# Patient Record
Sex: Male | Born: 1975 | Race: Black or African American | Hispanic: No | Marital: Married | Smoking: Former smoker
Health system: Southern US, Community
[De-identification: ages and names within clinical notes are randomized; demographics above are authoritative.]

## PROBLEM LIST (undated history)

## (undated) DIAGNOSIS — E079 Disorder of thyroid, unspecified: Secondary | ICD-10-CM

## (undated) DIAGNOSIS — E7800 Pure hypercholesterolemia, unspecified: Secondary | ICD-10-CM

## (undated) DIAGNOSIS — M549 Dorsalgia, unspecified: Secondary | ICD-10-CM

## (undated) DIAGNOSIS — I10 Essential (primary) hypertension: Secondary | ICD-10-CM

## (undated) HISTORY — DX: Dorsalgia, unspecified: M54.9

---

## 2009-04-13 ENCOUNTER — Emergency Department (HOSPITAL_COMMUNITY): Admission: EM | Admit: 2009-04-13 | Discharge: 2009-04-13 | Payer: Self-pay | Admitting: Emergency Medicine

## 2009-06-18 ENCOUNTER — Encounter: Admission: RE | Admit: 2009-06-18 | Discharge: 2009-06-18 | Payer: Self-pay | Admitting: Infectious Diseases

## 2009-07-02 ENCOUNTER — Encounter (INDEPENDENT_AMBULATORY_CARE_PROVIDER_SITE_OTHER): Payer: Self-pay | Admitting: *Deleted

## 2009-08-02 DIAGNOSIS — M549 Dorsalgia, unspecified: Secondary | ICD-10-CM | POA: Insufficient documentation

## 2009-08-02 DIAGNOSIS — R109 Unspecified abdominal pain: Secondary | ICD-10-CM | POA: Insufficient documentation

## 2009-08-02 DIAGNOSIS — R21 Rash and other nonspecific skin eruption: Secondary | ICD-10-CM

## 2009-08-05 ENCOUNTER — Ambulatory Visit: Payer: Self-pay | Admitting: Gastroenterology

## 2009-08-05 ENCOUNTER — Encounter (INDEPENDENT_AMBULATORY_CARE_PROVIDER_SITE_OTHER): Payer: Self-pay | Admitting: *Deleted

## 2009-08-09 ENCOUNTER — Ambulatory Visit (HOSPITAL_COMMUNITY): Admission: RE | Admit: 2009-08-09 | Discharge: 2009-08-09 | Payer: Self-pay | Admitting: Gastroenterology

## 2009-08-13 LAB — CONVERTED CEMR LAB
ALT: 23 units/L (ref 0–53)
AST: 25 units/L (ref 0–37)
Alkaline Phosphatase: 65 units/L (ref 39–117)
Amylase: 173 units/L — ABNORMAL HIGH (ref 27–131)
BUN: 5 mg/dL — ABNORMAL LOW (ref 6–23)
Basophils Relative: 0.8 % (ref 0.0–3.0)
Chloride: 102 meq/L (ref 96–112)
Eosinophils Absolute: 0.2 10*3/uL (ref 0.0–0.7)
Eosinophils Relative: 3.7 % (ref 0.0–5.0)
Glucose, Bld: 93 mg/dL (ref 70–99)
Iron: 131 ug/dL (ref 42–165)
Lipase: 14 units/L (ref 11.0–59.0)
Lymphocytes Relative: 35.9 % (ref 12.0–46.0)
Potassium: 4.1 meq/L (ref 3.5–5.1)
Total Bilirubin: 1 mg/dL (ref 0.3–1.2)
Total Protein: 7.5 g/dL (ref 6.0–8.3)
Transferrin: 255.1 mg/dL (ref 212.0–360.0)
Vitamin B-12: 150 pg/mL — ABNORMAL LOW (ref 211–911)
WBC: 4.4 10*3/uL — ABNORMAL LOW (ref 4.5–10.5)

## 2009-08-16 ENCOUNTER — Ambulatory Visit: Payer: Self-pay | Admitting: Gastroenterology

## 2009-08-17 LAB — CONVERTED CEMR LAB: UREASE: NEGATIVE

## 2009-08-18 ENCOUNTER — Telehealth (INDEPENDENT_AMBULATORY_CARE_PROVIDER_SITE_OTHER): Payer: Self-pay | Admitting: *Deleted

## 2009-08-19 ENCOUNTER — Encounter: Payer: Self-pay | Admitting: Gastroenterology

## 2009-08-31 ENCOUNTER — Encounter (INDEPENDENT_AMBULATORY_CARE_PROVIDER_SITE_OTHER): Payer: Self-pay | Admitting: *Deleted

## 2009-09-01 ENCOUNTER — Ambulatory Visit: Payer: Self-pay | Admitting: Gastroenterology

## 2009-09-01 DIAGNOSIS — D518 Other vitamin B12 deficiency anemias: Secondary | ICD-10-CM | POA: Insufficient documentation

## 2009-09-10 ENCOUNTER — Ambulatory Visit: Payer: Self-pay | Admitting: Gastroenterology

## 2009-09-15 ENCOUNTER — Ambulatory Visit: Payer: Self-pay | Admitting: Gastroenterology

## 2009-09-17 ENCOUNTER — Ambulatory Visit: Payer: Self-pay | Admitting: Gastroenterology

## 2009-10-15 ENCOUNTER — Telehealth: Payer: Self-pay | Admitting: Gastroenterology

## 2009-10-15 ENCOUNTER — Ambulatory Visit: Payer: Self-pay | Admitting: Gastroenterology

## 2009-10-20 ENCOUNTER — Encounter (INDEPENDENT_AMBULATORY_CARE_PROVIDER_SITE_OTHER): Payer: Self-pay | Admitting: *Deleted

## 2009-11-12 ENCOUNTER — Ambulatory Visit: Payer: Self-pay | Admitting: Gastroenterology

## 2010-08-30 NOTE — Assessment & Plan Note (Signed)
Summary: b12 shot...em  Nurse Visit   Allergies: No Known Drug Allergies  Medication Administration  Injection # 1:    Medication: Vit B12 1000 mcg    Diagnosis: ANEMIA, VITAMIN B12 DEFICIENCY (ICD-281.1)    Route: IM    Site: L deltoid    Exp Date: 11/12    Lot #: [id number]    Mfr: American Regent    Comments: #2 B12 injection next week.    Patient tolerated injection without complications    Given by: Milford Cage NCMA (September 01, 2009 2:34 PM)  Orders Added: 1)  Vit B12 1000 mcg [J3420]

## 2010-08-30 NOTE — Assessment & Plan Note (Signed)
Summary: WEEKLY B12 SHOT...LSW.  Nurse Visit   Allergies: No Known Drug Allergies  Medication Administration  Injection # 1:    Medication: Vit B12 1000 mcg    Diagnosis: ANEMIA, VITAMIN B12 DEFICIENCY (ICD-281.1)    Route: IM    Site: L deltoid    Exp Date: 11/12    Lot #: [id number]    Mfr: American Regent    Patient tolerated injection without complications    Given by: Hortense Ramal CMA Duncan Dull) (September 10, 2009 1:59 PM)  Orders Added: 1)  Vit B12 1000 mcg [J3420]

## 2010-08-30 NOTE — Assessment & Plan Note (Signed)
Summary: stomach pain/pos ulcers...as.   History of Present Illness Visit Type: consult Primary GI MD: Sheryn Bison MD FACP FAGA Primary Provider: Levander Campion, MD Requesting Provider: Levander Campion, MD Chief Complaint: abdominal pain for years, pain can happen any time of day with no relation to food History of Present Illness:   This patient is a 35 year old male from [place] referred by Dr. Levander Campion for evaluation of years of recurrent abdominal gas, bloating, epigastric and lower gastrointestinal abdominal pain. He is been treated on several occasions for H. pylori in without improvement in his symptomatology. He denies any history of known ulcerations but apparently has had previous endoscopy in [place]. He is rather definite lactose intolerance which seems to exacerbate his abdominal cramping and excessive gas. He denies melena, hematochezia, anorexia, weight loss, or any specific hepatobiliary problems. He recently completed therapy with PPI, amoxicillin, for possible H. pylori  infection. serologic studies for celiac disease were negative. The patient specifically denies any systemic complaints such as fever, chills, nausea and vomiting, et Karie Soda. 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 Family history is unclear.   GI Review of Systems    Reports abdominal pain and  acid reflux.     Location of  Abdominal pain: generalized.    Denies belching, bloating, chest pain, dysphagia with liquids, dysphagia with solids, heartburn, loss of appetite, nausea, vomiting, vomiting blood, weight loss, and  weight gain.      Reports black tarry stools.     Denies anal fissure, change in bowel habit, constipation, diarrhea, diverticulosis, fecal incontinence, heme positive stool, hemorrhoids, irritable bowel syndrome, jaundice, light color stool, liver problems, rectal bleeding, and  rectal pain.    Current Medications (verified): 1)  Isoniazid  300 Mg Tabs (Isoniazid) .... Take 1 Tablet By Mouth Once A Day For 9 Months 2)  Vitamin B-6 25 Mg Tabs (Pyridoxine Hcl) .... Take 1 Tablet By Mouth Once A Day  Allergies (verified): No Known Drug Allergies  Past History:  Past medical, surgical, family and social histories (including risk factors) reviewed for relevance to current acute and chronic problems.  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 No FH of Colon Cancer:  Social History: Reviewed history from 08/02/2009 and no changes required. Married Patient is a former smoker.  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  The patient denies change in vision, menstrual pain, and pregnancy symptoms.    Vital Signs:  Patient profile:   35 year old male Height:      70.25 inches Weight:      137 pounds BMI:     19.59 Pulse rate:   80 / minute Pulse rhythm:   regular BP sitting:   100 / 72  (left arm) Cuff size:   regular  Vitals Entered By: Francee Piccolo CMA Duncan Dull) (August 05, 2009 9:20 AM)  Physical Exam  General:  Well developed, well nourished, no acute distress.healthy appearing.   Head:  Normocephalic and atraumatic. Eyes:  PERRLA, no icterus.exam deferred to patient's ophthalmologist.   Neck:  Supple; no masses or thyromegaly. Lungs:  Clear throughout to auscultation. Heart:  Regular rate and rhythm; no murmurs,  rubs,  or bruits. Abdomen:  Soft, nontender and nondistended. No masses, hepatosplenomegaly or hernias noted. Normal bowel sounds. Extremities:  No clubbing, cyanosis, edema or deformities noted. Neurologic:  Alert and  oriented x4;  grossly normal neurologically. Cervical Nodes:  No  significant cervical adenopathy. Inguinal Nodes:  No significant inguinal adenopathy. Psych:  Alert and cooperative. Normal mood and affect.   Impression & Recommendations:  Problem # 1:  ABDOMINAL PAIN (ICD-789.00) Assessment Unchanged His symptomatology is most consistent with lactose intolerance and irritable bowel syndrome. I seriously doubt that he has active H. pylori infection. I have ordered ultrasound to exclude cholelithiasis, repeat labs including liver function tests, and we'll proceed with endoscopic exam with gastric and small bowel biopsies. I placed him on a lactose free diet with p.r.n. Lactaid use. I have little to add to her otherwise excellent workup with Dr. Gertie Gowda.Depending on his clinical workup and course, he may need colonoscopy exam and also consideration of stool exams to exclude intestinal parasites. Orders: TLB-CBC Platelet - w/Differential (85025-CBCD) TLB-BMP (Basic Metabolic Panel-BMET) (80048-METABOL) TLB-Hepatic/Liver Function Pnl (80076-HEPATIC) TLB-TSH (Thyroid Stimulating Hormone) (84443-TSH) TLB-B12, Serum-Total ONLY (54098-J19) TLB-Ferritin (82728-FER) TLB-Folic Acid (Folate) (82746-FOL) TLB-IBC Pnl (Iron/FE;Transferrin) (83550-IBC) TLB-Amylase (82150-AMYL) TLB-Lipase (83690-LIPASE)  Problem # 2:  POSITIVE PPD (ICD-795.5) Assessment: Unchanged  Patient Instructions: 1)  Copy sent to : Dr. Levander Campion 2)  Please continue current medications.  3)  Lactose Free Diet handout given.  4)  Conscious Sedation brochure given.  5)  Upper Endoscopy brochure given.  6)  Ultrasound scheduled. 7)  Trial of Levsin as needed. 8)  The medication list was reviewed and reconciled.  All changed / newly prescribed medications were explained.  A complete medication list was provided to the patient / caregiver.  Appended Document: stomach pain/pos ulcers...as.    Clinical Lists Changes  Medications: Added new medication of LEVSIN 0.125 MG  TABS  (HYOSCYAMINE SULFATE) Take one tab q 4-6 hrs prn - Signed Rx of LEVSIN 0.125 MG  TABS (HYOSCYAMINE SULFATE) Take one tab q 4-6 hrs prn;  #40 x 3;  Signed;  Entered by: Ashok Cordia RN;  Authorized by: Mardella Layman MD [facility];  Method used: Electronically to [facility] [phone]*, [address], [place], [place], [place]  [address], Ph: [phone], Fax: [phone] Orders: Added new Test order of EGD (EGD) - Signed Added new Test order of Ultrasound Abdomen (UAS) - Signed    Prescriptions: LEVSIN 0.125 MG  TABS (HYOSCYAMINE SULFATE) Take one tab q 4-6 hrs prn  #40 x 3   Entered by:   Ashok Cordia RN   Authorized by:   Mardella Layman MD [facility]   Signed by:   Ashok Cordia RN on 08/05/2009   Method used:   Electronically to        [facility] [phone]* (retail)       [address]       [place], [place]  [address]       Ph: [phone]       Fax: [phone]   RxID:   [id number]

## 2010-08-30 NOTE — Progress Notes (Signed)
Summary: Reporting  Phone Note Call from Patient   Caller: Patient Summary of Call: Pt reporting:  Medication given when here last is not helping.  Pt states Dr. Jarold Motto told him if med did not help that it could be changed to something else.  Pt did not know name of med, but would like to try something else.  Levsin was givne at last OV.  Colon report mentions trial of amitiza.   Initial call taken by: Ashok Cordia RN,  October 15, 2009 3:34 PM  Follow-up for Phone Call        office f/u Follow-up by: Mardella Layman MD FACG,  October 18, 2009 9:46 AM  Additional Follow-up for Phone Call Additional follow up Details #1::        LM for pt to call and sch appt. Ashok Cordia RN  October 19, 2009 9:51 AM  Letter sent to pt requesting pt call and make appt. Additional Follow-up by: Ashok Cordia RN,  October 20, 2009 3:55 PM

## 2010-08-30 NOTE — Assessment & Plan Note (Signed)
Summary: WEEKLY B12 SHOT...LSW.  Nurse Visit   Medication Administration  Injection # 1:    Medication: Vit B12 1000 mcg    Diagnosis: ANEMIA, VITAMIN B12 DEFICIENCY (ICD-281.1)    Route: IM    Site: L deltoid    Exp Date: 06/2011    Lot #: [id number]    Mfr: American Regent    Comments: pt will return on 3/18 for next injection    Patient tolerated injection without complications    Given by: Francee Piccolo CMA Duncan Dull) (September 17, 2009 3:38 PM)

## 2010-08-30 NOTE — Miscellaneous (Signed)
Summary: Clotest  Clinical Lists Changes  Orders: Added new Test order of TLB-H Pylori Screen Gastric Biopsy (83013-CLOTEST) - Signed 

## 2010-08-30 NOTE — Procedures (Signed)
Summary: Upper Endoscopy  Patient: [name] Note: All result statuses are Final unless otherwise noted.  Tests: (1) Upper Endoscopy (EGD)   EGD Upper Endoscopy       DONE     [facility]     [address]     [place], [place]  [address]           ENDOSCOPY PROCEDURE REPORT           PATIENT:  Allen Ramos, Allen Ramos  MR#:  [id number]     BIRTHDATE:  1976-03-16, 33 yrs. old  GENDER:  male           ENDOSCOPIST:  Vania Rea. Jarold Motto, MD, [facility]     Referred by:           PROCEDURE DATE:  08/16/2009     PROCEDURE:  EGD with biopsy     ASA CLASS:  Class II     INDICATIONS:  abdominal pain Recurrent epigastric pain and hx of     H.pylori Rx.Ultrasound exam is WNL.           MEDICATIONS:   Fentanyl 50 mcg IV, Versed 6 mg IV     TOPICAL ANESTHETIC:  Exactacain Spray           DESCRIPTION OF PROCEDURE:   After the risks benefits and     alternatives of the procedure were thoroughly explained, informed     consent was obtained.  The LB [id number] endoscope was     introduced through the mouth and advanced to the second portion of     the duodenum, without limitations.  The instrument was slowly     withdrawn as the mucosa was fully examined.     <<PROCEDUREIMAGES>>           Normal duodenal folds were noted. biopsy done.  The esophagus and     gastroesophageal junction were completely normal in appearance.     The stomach was entered and closely examined. The antrum,     angularis, and lesser curvature were well visualized, including a     retroflexed view of the cardia and fundus. The stomach wall was     normally distensable. The scope passed easily through the pylorus     into the duodenum. clo and regular biopsies done.    Retroflexed     views revealed no abnormalities.    The scope was then withdrawn     from the patient and the procedure completed.           COMPLICATIONS:  None           ENDOSCOPIC IMPRESSION:     1) Normal duodenal folds     2) Normal  esophagus     3) Normal stomach     NO ACUTE OR CHRONIC PUD.PROBABLE FUNCTIONAL DYSPEPSIA.     RECOMMENDATIONS:     1) await biopsy results     2) continue current medications     3) Rx CLO if positive     4) My office will schedule a colonoscopy           REPEAT EXAM:  No           ______________________________     Vania Rea. Jarold Motto, MD, [name]           CC:           n.     eSIGNED:   Vania Rea. Patterson at 08/16/2009 03:12 PM  [name], [name], [phone]  Note: An exclamation mark (!) indicates a result that was not dispersed into the flowsheet. Document Creation Date: 08/16/2009 3:09 PM _______________________________________________________________________  (1) Order result status: Final Collection or observation date-time: 08/16/2009 15:02 Requested date-time:  Receipt date-time:  Reported date-time:  Referring Physician:   Ordering Physician: Sheryn Bison [phone]) Specimen Source:  Source: Launa Grill Order Number: [id number] Lab site:   Appended Document: Upper Endoscopy PLEASE SCHEDULE COLONOSCOPY...

## 2010-08-30 NOTE — Miscellaneous (Signed)
Summary: [facility] Previsit/prep  Clinical Lists Changes  Medications: Added new medication of MOVIPREP 100 GM  SOLR (PEG-KCL-NACL-NASULF-NA ASC-C) As per prep instructions. - Signed Rx of MOVIPREP 100 GM  SOLR (PEG-KCL-NACL-NASULF-NA ASC-C) As per prep instructions.;  #1 x 0;  Signed;  Entered by: Wyona Almas RN;  Authorized by: Mardella Layman MD [facility];  Method used: Electronically to [facility]. [phone]*, [address] [place], [place], [place]  [address], Ph: [phone], Fax: [phone] Observations: Added new observation of NKA: T (09/01/2009 13:26)    Prescriptions: MOVIPREP 100 GM  SOLR (PEG-KCL-NACL-NASULF-NA ASC-C) As per prep instructions.  #1 x 0   Entered by:   Wyona Almas RN   Authorized by:   Mardella Layman MD [facility]   Signed by:   Wyona Almas RN on 09/01/2009   Method used:   Electronically to        [facility] [phone]* (retail)       [address]       [place], [place]  [address]       Ph: [phone]       Fax: [phone]   RxID:   [id number]

## 2010-08-30 NOTE — Assessment & Plan Note (Signed)
Summary: stomach pain...em   History of Present Illness Visit Type: Follow-up Visit Primary GI MD: Sheryn Bison MD FACP FAGA Primary Provider: Levander Campion, MD Requesting Provider: Levander Campion, MD Chief Complaint: Patient c/o several years intermittent epigastric discomfort along with some occasional chest discomfort. He denies any nasuea, vomiting or lower GI symptoms with the exception of some constipation. History of Present Illness:   Continued periodic crampy mid abdominal pain without other symptomatology. He has had an extensive evaluation including ultrasound, endoscopy, colonoscopy, and multiple biopsies all of which have been normal. He also continues with chronic constipation refractory to a trial of Amitiza therapy. There no systemic complaints or hepatobiliary symptoms. There is no obvious language barrier which makes communication difficult.   GI Review of Systems    Reports abdominal pain, acid reflux, and  heartburn.     Location of  Abdominal pain: epigastric area.    Denies belching, bloating, chest pain, dysphagia with liquids, dysphagia with solids, loss of appetite, nausea, vomiting, vomiting blood, weight loss, and  weight gain.      Reports constipation.     Denies anal fissure, black tarry stools, change in bowel habit, diarrhea, diverticulosis, fecal incontinence, heme positive stool, hemorrhoids, irritable bowel syndrome, jaundice, light color stool, liver problems, rectal bleeding, and  rectal pain.    Current Medications (verified): 1)  Isoniazid 300 Mg Tabs (Isoniazid) .... Take 1 Tablet By Mouth Once A Day For 9 Months 2)  Vitamin B-6 25 Mg Tabs (Pyridoxine Hcl) .... Take 1 Tablet By Mouth Once A Day  Allergies (verified): No Known Drug Allergies  Past History:  Past medical, surgical, family and social histories (including risk factors) reviewed for relevance to current acute and chronic problems.  Past Medical History: Reviewed history from  08/05/2009 and no changes required. 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 none  Family History: Reviewed history from 08/05/2009 and no changes required. No FH of Colon Cancer:  Social History: Reviewed history from 08/05/2009 and no changes required. Married Patient is a former smoker.  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  The patient denies allergy/sinus, anemia, anxiety-new, arthritis/joint pain, blood in urine, breast changes/lumps, change in vision, confusion, cough, coughing up blood, depression-new, fainting, fatigue, fever, headaches-new, hearing problems, heart murmur, heart rhythm changes, itching, menstrual pain, muscle pains/cramps, night sweats, nosebleeds, pregnancy symptoms, shortness of breath, skin rash, sleeping problems, sore throat, swelling of feet/legs, swollen lymph glands, thirst - excessive , urination - excessive , urination changes/pain, urine leakage, vision changes, and voice change.    Vital Signs:  Patient profile:   35 year old male Height:      70.25 inches Weight:      139.25 pounds BMI:     19.91 BSA:     1.80 Pulse rate:   88 / minute Pulse rhythm:   regular BP sitting:   120 / 64  (left arm)  Vitals Entered By: Hortense Ramal CMA Duncan Dull) (November 12, 2009 11:30 AM)  Physical Exam  General:  Well developed, well nourished, no acute distress. Head:  Normocephalic and atraumatic. Eyes:  PERRLA, no icterus.exam deferred to patient's ophthalmologist.  exam deferred to patient's ophthalmologist.   Abdomen:  Soft, nontender and nondistended. No masses,  hepatosplenomegaly or hernias noted. Normal bowel sounds. Psych:  Alert and cooperative. Normal mood and  affect.depressed affect.  depressed affect.     Impression & Recommendations:  Problem # 1:  ANEMIA, VITAMIN B12 DEFICIENCY (ICD-281.1) Assessment Improved Continue parenteral replacement therapy.  Problem # 2:  POSITIVE PPD (ICD-795.5) Assessment: Unchanged continue isoniazid therapy as per infectious disease  Problem # 3:  ABDOMINAL PAIN (ICD-789.00) Assessment: Unchanged Workup consistent with IBS, constipation predominant. Have instituted a trial of AcipHex 20 mg a day, Pamine 2.5 mg twice a day, 8 ounces of MiraLax at bedtime, and probiotic therapy with [employer] Colon Health b.i.d.  Anticoagulation Management Assessment/Plan:            Patient Instructions: 1)  Begin Miralax at bedtime and [facility] Colon health two times a day. You may purchase these at your drugstore.   2)  Rx will be sent to your pharmacy for aciphex and pamine. 3)  The medication list was reviewed and reconciled.  All changed / newly prescribed medications were explained.  A complete medication list was provided to the patient / caregiver. 4)  Copy sent to : Dr. Levander Campion.  Appended Document: stomach pain...em    Clinical Lists Changes  Orders: Added new Service order of Vit B12 1000 mcg (J3420) - Signed       Medication Administration  Injection # 1:    Medication: Vit B12 1000 mcg    Diagnosis: ANEMIA, VITAMIN B12 DEFICIENCY (ICD-281.1)    Route: IM    Site: L deltoid    Exp Date: 2/13    Lot #: [id number]    Mfr: American Regent    Patient tolerated injection without complications    Given by: Hortense Ramal CMA Duncan Dull) (November 12, 2009 12:02 PM)  Orders Added: 1)  Vit B12 1000 mcg [J3420]  Appended Document: stomach pain...em    Clinical Lists Changes  Medications: Added new medication of MIRALAX   POWD (POLYETHYLENE GLYCOL 3350) q hs Added new medication of [facility] COLON HEALTH  CAPS (PROBIOTIC PRODUCT) two times a day Added new medication of ACIPHEX 20 MG TBEC (RABEPRAZOLE  SODIUM) 1 by mouth q am - Signed Added new medication of PAMINE 2.5 MG  TABS (METHSCOPOLAMINE BROMIDE) 1 by mouth two times a day - Signed Rx of ACIPHEX 20 MG TBEC (RABEPRAZOLE SODIUM) 1 by mouth q am;  #30 x 11;  Signed;  Entered by: Ashok Cordia RN;  Authorized by: Mardella Layman MD [facility];  Method used: Electronically to [facility]. [phone]*, [address] [place], [place], [place]  [address], Ph: [phone], Fax: [phone] Rx of PAMINE 2.5 MG  TABS (METHSCOPOLAMINE BROMIDE) 1 by mouth two times a day;  #60 x 6;  Signed;  Entered by: Ashok Cordia RN;  Authorized by: Mardella Layman MD [facility];  Method used: Electronically to [facility]. [phone]*, [address] [place], [place], [place]  [address], Ph: [phone], Fax: [phone]    Prescriptions: PAMINE 2.5 MG  TABS (METHSCOPOLAMINE BROMIDE) 1 by mouth two times a day  #60 x 6   Entered by:   Ashok Cordia RN   Authorized by:   Mardella Layman MD [facility]   Signed by:   Ashok Cordia RN on 11/12/2009   Method used:   Electronically to        [facility] [phone]* (retail)       [address]  [place], [place]  [address]       Ph: [phone]       Fax: [phone]   RxID:   [id number] ACIPHEX 20 MG TBEC (RABEPRAZOLE SODIUM) 1 by mouth q am  #30 x 11   Entered by:   Ashok Cordia RN   Authorized by:   Mardella Layman MD [facility]   Signed by:   Ashok Cordia RN on 11/12/2009   Method used:   Electronically to        [facility] [phone]* (retail)       [address]       [place], [place]  [address]       Ph: [phone]       Fax: [phone]   RxID:   [id number]

## 2010-08-30 NOTE — Letter (Signed)
Summary: [facility] Instructions  [facility]  [address] [place], [place] [address]   Phone: [phone]  Fax: [phone]       Allen Ramos    01-22-1976    [name]        Procedure Day Allen Ramos:  [facility]  09/15/09     Arrival Time:  9:00AM     Procedure Time:  10:00AM     Location of Procedure:                    _X _  [facility] (4th Floor)                        PREPARATION FOR COLONOSCOPY WITH MOVIPREP   Starting 5 days prior to your procedure 09/10/09 do not eat nuts, seeds, popcorn, corn, beans, peas,  salads, or any raw vegetables.  Do not take any fiber supplements (e.g. Metamucil, Citrucel, and Benefiber).  THE DAY BEFORE YOUR PROCEDURE         DATE: 09/14/09  DAY: TUESDAY  1.  Drink clear liquids the entire day-NO SOLID FOOD  2.  Do not drink anything colored red or purple.  Avoid juices with pulp.  No orange juice.  3.  Drink at least 64 oz. (8 glasses) of fluid/clear liquids during the day to prevent dehydration and help the prep work efficiently.  CLEAR LIQUIDS INCLUDE: Water Jello Ice Popsicles Tea (sugar ok, no milk/cream) Powdered fruit flavored drinks Coffee (sugar ok, no milk/cream) Gatorade Juice: apple, white grape, white cranberry  Lemonade Clear bullion, consomm, broth Carbonated beverages (any kind) Strained chicken noodle soup Hard Candy                             4.  In the morning, mix first dose of MoviPrep solution:    Empty 1 Pouch A and 1 Pouch B into the disposable container    Add lukewarm drinking water to the top line of the container. Mix to dissolve    Refrigerate (mixed solution should be used within 24 hrs)  5.  Begin drinking the prep at 5:00 p.m. The MoviPrep container is divided by 4 marks.   Every 15 minutes drink the solution down to the next mark (approximately 8 oz) until the full liter is complete.   6.  Follow completed prep with 16 oz of clear liquid of your choice  (Nothing red or purple).  Continue to drink clear liquids until bedtime.  7.  Before going to bed, mix second dose of MoviPrep solution:    Empty 1 Pouch A and 1 Pouch B into the disposable container    Add lukewarm drinking water to the top line of the container. Mix to dissolve    Refrigerate  THE DAY OF YOUR PROCEDURE      DATE: 09/15/09   DAY: WEDNESDAY  Beginning at 5:00AM (5 hours before procedure):         1. Every 15 minutes, drink the solution down to the next mark (approx 8 oz) until the full liter is complete.  2. Follow completed prep with 16 oz. of clear liquid of your choice.    3. You may drink clear liquids until 8:00AM (2 HOURS BEFORE PROCEDURE).   MEDICATION INSTRUCTIONS  Unless otherwise instructed, you should take regular prescription medications with a small sip of water   as early as possible the  morning of your procedure.         OTHER INSTRUCTIONS  You will need a responsible adult at least 37 years of age to accompany you and drive you home.   This person must remain in the waiting room during your procedure.  Wear loose fitting clothing that is easily removed.  Leave jewelry and other valuables at home.  However, you may wish to bring a book to read or  an iPod/MP3 player to listen to music as you wait for your procedure to start.  Remove all body piercing jewelry and leave at home.  Total time from sign-in until discharge is approximately 2-3 hours.  You should go home directly after your procedure and rest.  You can resume normal activities the  day after your procedure.  The day of your procedure you should not:   Drive   Make legal decisions   Operate machinery   Drink alcohol   Return to work  You will receive specific instructions about eating, activities and medications before you leave.    The above instructions have been reviewed and explained to me by  Wyona Almas RN  September 01, 2009 2:00 PM     I fully  understand and can verbalize these instructions _____________________________ Date _________

## 2010-08-30 NOTE — Assessment & Plan Note (Signed)
Summary: B12 SHOT...LSW.  Nurse Visit   Allergies: No Known Drug Allergies  Medication Administration  Injection # 1:    Medication: Vit B12 1000 mcg    Diagnosis: ANEMIA, VITAMIN B12 DEFICIENCY (ICD-281.1)    Route: IM    Site: L deltoid    Exp Date: 12/12    Lot #: [id number]    Mfr: [employer]ent    Patient tolerated injection without complications    Given by: Ashok Cordia RN (October 15, 2009 3:30 PM)  Orders Added: 1)  Vit B12 1000 mcg [J3420]

## 2010-08-30 NOTE — Progress Notes (Signed)
Summary: B12  Phone Note Call from Patient Call back at [phone]   Caller: Patient Call For: Dr. Jarold Motto Reason for Call: Talk to Nurse Summary of Call: pt says he is supposed to set up B12 injections... how would Dr. Jarold Motto like for these to be sch'ed?... does this need to be discussed with the pt first? Initial call taken by: Vallarie Mare,  August 18, 2009 12:48 PM  Follow-up for Phone Call        Pt needs to be sent up appt to get first B12 injection.  He also needs to sch colonoscopy.  Pt will need interperator, he does not speek english.  Needs to come for previsit with interperator.    Follow-up by: Ashok Cordia RN,  August 18, 2009 1:00 PM

## 2010-08-30 NOTE — Procedures (Signed)
Summary: Colonoscopy  Patient: Laker Thompson Note: All result statuses are Final unless otherwise noted.  Tests: (1) Colonoscopy (COL)   COL Colonoscopy           DONE     [facility]     [address]     [place], [place]  [address]           COLONOSCOPY PROCEDURE REPORT           PATIENT:  [place], [place]  MR#:  [id number]     BIRTHDATE:  1976/01/18, 33 yrs. old  GENDER:  male           ENDOSCOPIST:  Vania Rea. Jarold Motto, MD, [facility]     Referred by:           PROCEDURE DATE:  09/15/2009     PROCEDURE:  Colonoscopy, Diagnostic     ASA CLASS:  Class II     INDICATIONS:  abdominal pain, constipation           MEDICATIONS:   Fentanyl 50 mcg IV, Versed 6 mg IV           DESCRIPTION OF PROCEDURE:   After the risks benefits and     alternatives of the procedure were thoroughly explained, informed     consent was obtained.  Digital rectal exam was performed and     revealed no abnormalities.   The LB CF-[id number] endoscope     was introduced through the anus and advanced to the cecum, which     was identified by both the appendix and ileocecal valve, without     limitations.  The quality of the prep was excellent, using     MoviPrep.  The instrument was then slowly withdrawn as the colon     was fully examined.     <<PROCEDUREIMAGES>>           FINDINGS:  No polyps or cancers were seen.  This was otherwise a     normal examination of the colon.  Internal hemorrhoids were found.     Retroflexed views in the rectum revealed internal hemorrhoids.     The scope was then withdrawn from the patient and the procedure     completed.           COMPLICATIONS:  None           ENDOSCOPIC IMPRESSION:     1) No polyps or cancers     2) Otherwise normal examination     3) Internal hemorrhoids     CHRONIC CONSTIPATION PREDOMINANT IBS.     RECOMMENDATIONS:     1) High fiber diet with liberal fluid intake.     TRIAL OF AMITIZA BID WITH MEALS.MIRALAX 8 OZS QHS PRN.           REPEAT EXAM:  No           ______________________________     Vania Rea. Jarold Motto, MD, [name]           CC:           n.     eSIGNED:   Vania Rea. Patterson at 09/15/2009 10:47 AM           Theodosia Blender, [id number]  Note: An exclamation mark (!) indicates a result that was not dispersed into the flowsheet. Document Creation Date: 09/15/2009 10:47 AM _______________________________________________________________________  (1) Order result status: Final Collection or observation date-time: 09/15/2009 10:41 Requested date-time:  Receipt date-time:  Reported  date-time:  Referring Physician:   Ordering Physician: Sheryn Bison [phone]) Specimen Source:  Source: Launa Grill Order Number: [id number] Lab site:

## 2010-08-30 NOTE — Letter (Signed)
Summary: EGD Instructions  [facility]  [address] [place], [place] [address]   Phone: [phone]  Fax: [phone]       Allen Ramos    [birth year]    MRN: [id number]       Procedure Day /Date: Monday, 08/16/09     Arrival Time:  1:30 pm     Procedure Time: 2:30pm     Location of Procedure:                    _X_ [facility] (4th Floor)  PREPARATION FOR ENDOSCOPY  On Monday, 08/16/09, THE DAY OF THE PROCEDURE:  1.   No solid foods, milk or milk products are allowed after midnight the night before your procedure.  2.   Do not drink anything colored red or purple.  Avoid juices with pulp.  No orange juice.  3.  You may drink clear liquids until 12:30pm, which is 2 hours before your procedure.                                                                                                CLEAR LIQUIDS INCLUDE: Water Jello Ice Popsicles Tea (sugar ok, no milk/cream) Powdered fruit flavored drinks Coffee (sugar ok, no milk/cream) Gatorade Juice: apple, white grape, white cranberry  Lemonade Clear bullion, consomm, broth Carbonated beverages (any kind) Strained chicken noodle soup Hard Candy   MEDICATION INSTRUCTIONS  Unless otherwise instructed, you should take regular prescription medications with a small sip of water as early as possible the morning of your procedure.  Additional medication instructions: None                 OTHER INSTRUCTIONS  You will need a responsible adult at least 37 years of age to accompany you and drive you home.   This person must remain in the waiting room during your procedure.  Wear loose fitting clothing that is easily removed.  Leave jewelry and other valuables at home.  However, you may wish to bring a book to read or an iPod/MP3 player to listen to music as you wait for your procedure to start.  Remove all body piercing jewelry and leave at home.  Total time from sign-in until  discharge is approximately 2-3 hours.  You should go home directly after your procedure and rest.  You can resume normal activities the day after your procedure.  The day of your procedure you should not:   Drive   Make legal decisions   Operate machinery   Drink alcohol   Return to work  You will receive specific instructions about eating, activities and medications before you leave.    The above instructions have been reviewed and explained to me by   _______________________    I fully understand and can verbalize these instructions _____________________________ Date _________

## 2010-08-30 NOTE — Letter (Signed)
Summary: Patient [facility] Biopsy Results  [facility]  [address] [place], [place] [address]   Phone: [phone]  Fax: [phone]        August 19, 2009 MRN: [id number]    Allen Ramos [address] [place], [place]  [address]    Dear Mr. Lovin,  I am pleased to inform you that the biopsies taken during your recent endoscopic examination did not show any evidence of cancer upon pathologic examination.  Additional information/recommendations:  __No further action is needed at this time.  Please follow-up with      your primary care physician for your other healthcare needs.  __ Please call [phone] to schedule a return visit to review      your condition.  xx__ Continue with the treatment plan as outlined on the day of your      exam.  __ You should have a repeat endoscopic examination for this problem              in _ months/years.   Please call us if you are having persistent problems or have questions about your condition that have not been fully answered at this time.  Sincerely,  Mardella Layman MD [facility]  This letter has been electronically signed by your physician.  Appended Document: Patient Notice-Endo Biopsy Results Letter mailed 1.21.11

## 2010-08-30 NOTE — Letter (Signed)
Summary: Appointment Reminder  [facility]  [address] [place], [place] [address]   Phone: [phone]  Fax: [phone]        October 20, 2009 MRN: [id number]    DEMONTEZ NOVACK [address] [place], [place]  [address]    Dear Mr. Minney,   We have been unable to reach you by phone to schedule a follow up   appointment that was recommended for you by Dr. Jarold Motto.  Dr.    Jarold Motto would like to see you again to review your symptoms.      Please contact us at [phone] to schedule your appiontment.       Sincerely,    Ashok Cordia RN

## 2010-11-04 LAB — URINALYSIS, ROUTINE W REFLEX MICROSCOPIC
Bilirubin Urine: NEGATIVE
Ketones, ur: NEGATIVE mg/dL
Leukocytes, UA: NEGATIVE
Nitrite: NEGATIVE
Protein, ur: 30 mg/dL — AB
Specific Gravity, Urine: 1.025 (ref 1.005–1.030)

## 2010-11-04 LAB — CBC
HCT: 42.4 % (ref 39.0–52.0)
Hemoglobin: 14.4 g/dL (ref 13.0–17.0)
MCHC: 34 g/dL (ref 30.0–36.0)
MCV: 93.8 fL (ref 78.0–100.0)
RBC: 4.52 MIL/uL (ref 4.22–5.81)

## 2010-11-04 LAB — DIFFERENTIAL
Basophils Absolute: 0 10*3/uL (ref 0.0–0.1)
Eosinophils Relative: 8 % — ABNORMAL HIGH (ref 0–5)
Lymphocytes Relative: 24 % (ref 12–46)
Monocytes Absolute: 0.6 10*3/uL (ref 0.1–1.0)
Monocytes Relative: 10 % (ref 3–12)

## 2010-11-04 LAB — POCT I-STAT, CHEM 8
BUN: 15 mg/dL (ref 6–23)
Calcium, Ion: 1.16 mmol/L (ref 1.12–1.32)
Creatinine, Ser: 1 mg/dL (ref 0.4–1.5)
TCO2: 25 mmol/L (ref 0–100)

## 2010-11-04 LAB — URINE MICROSCOPIC-ADD ON

## 2010-11-21 ENCOUNTER — Emergency Department (HOSPITAL_COMMUNITY)
Admission: EM | Admit: 2010-11-21 | Discharge: 2010-11-21 | Disposition: A | Discharge status: Home / Self Care | Payer: Self-pay | Attending: Emergency Medicine | Admitting: Emergency Medicine

## 2010-11-21 DIAGNOSIS — M549 Dorsalgia, unspecified: Secondary | ICD-10-CM | POA: Insufficient documentation

## 2011-11-04 IMAGING — CR DG CHEST 1V
2 series · 2 of 2 positions shown · non-contrast
Comparison: No comparison studies available.

CLINICAL DATA: Positive PPD.

CHEST - 1 VIEW

[w chest pa]
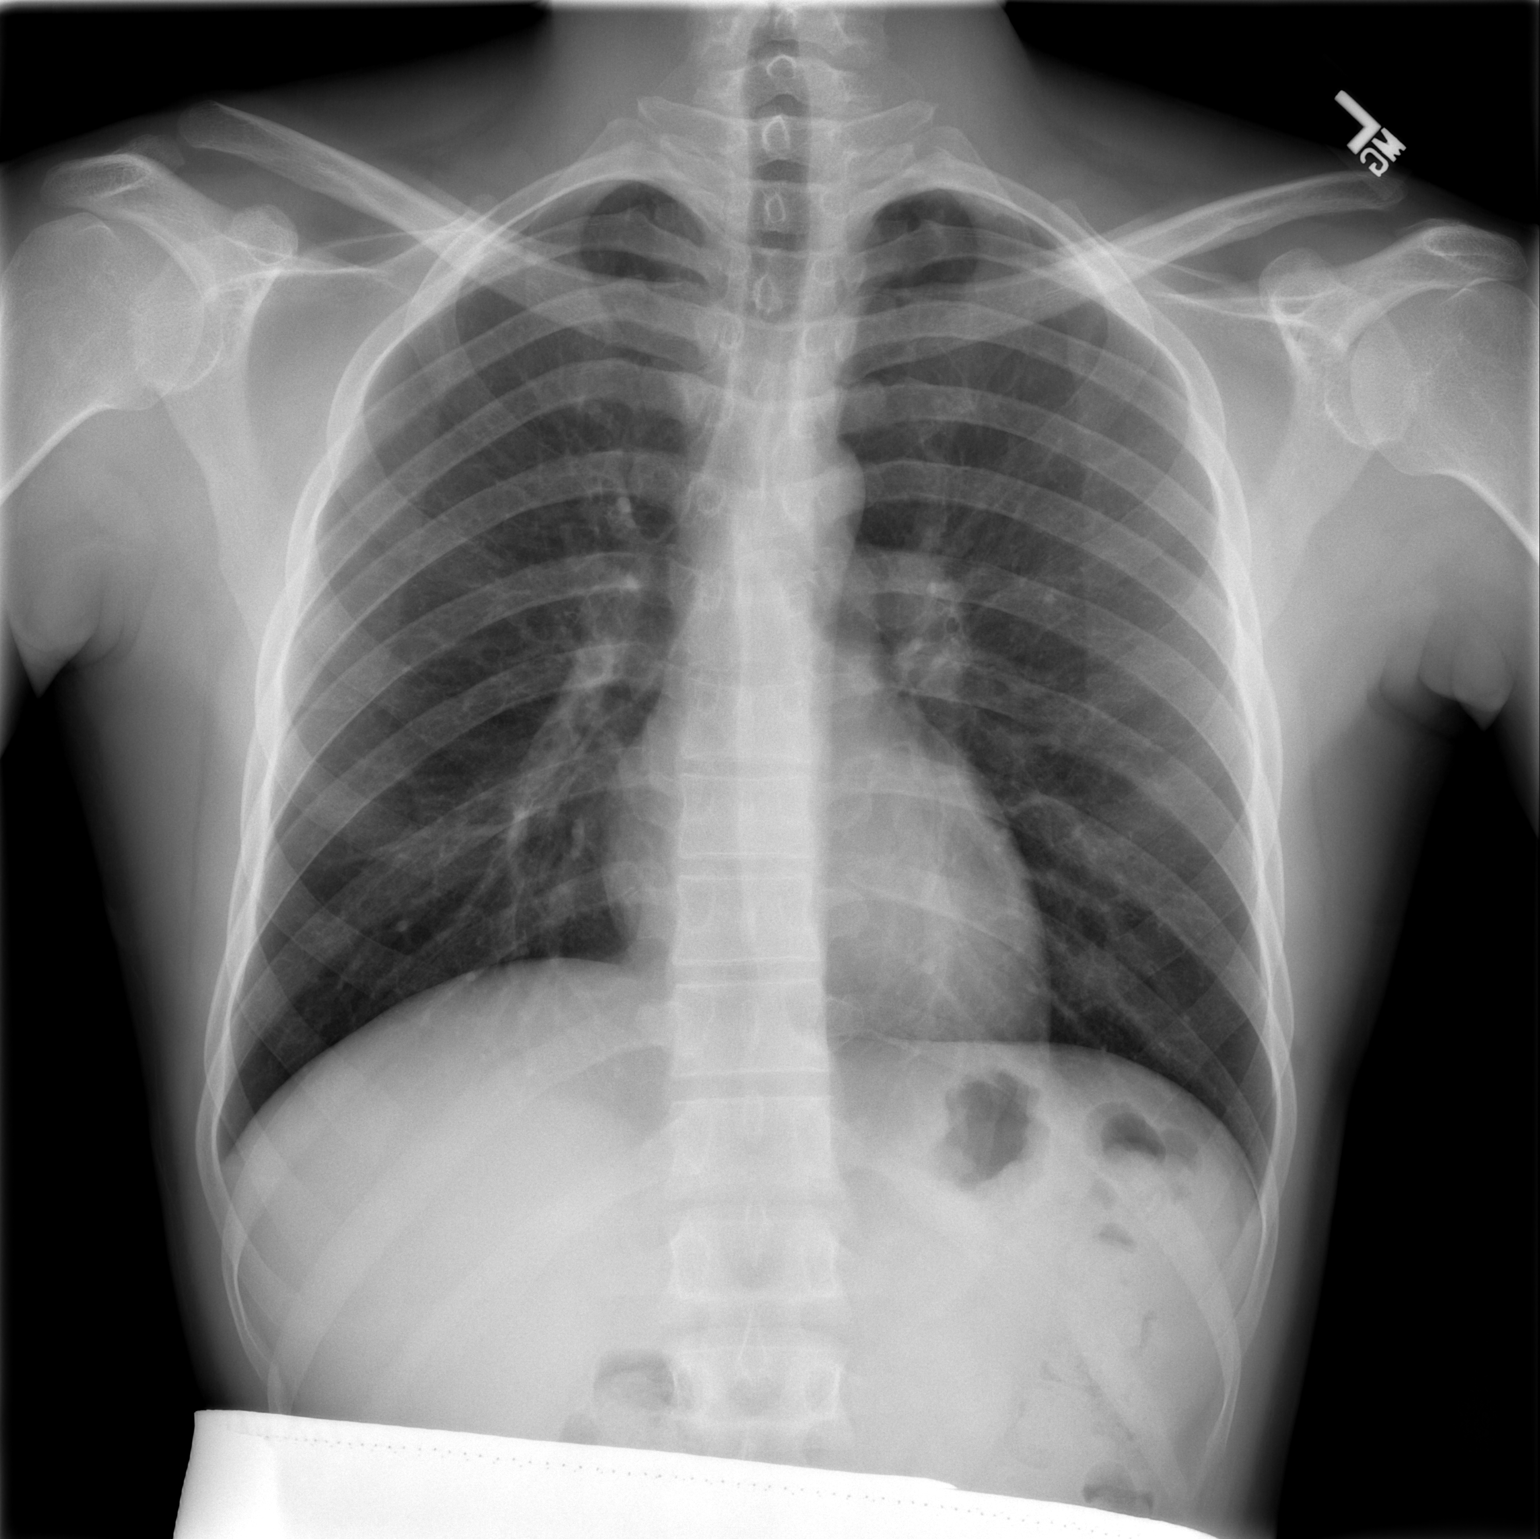

[w chest lat]
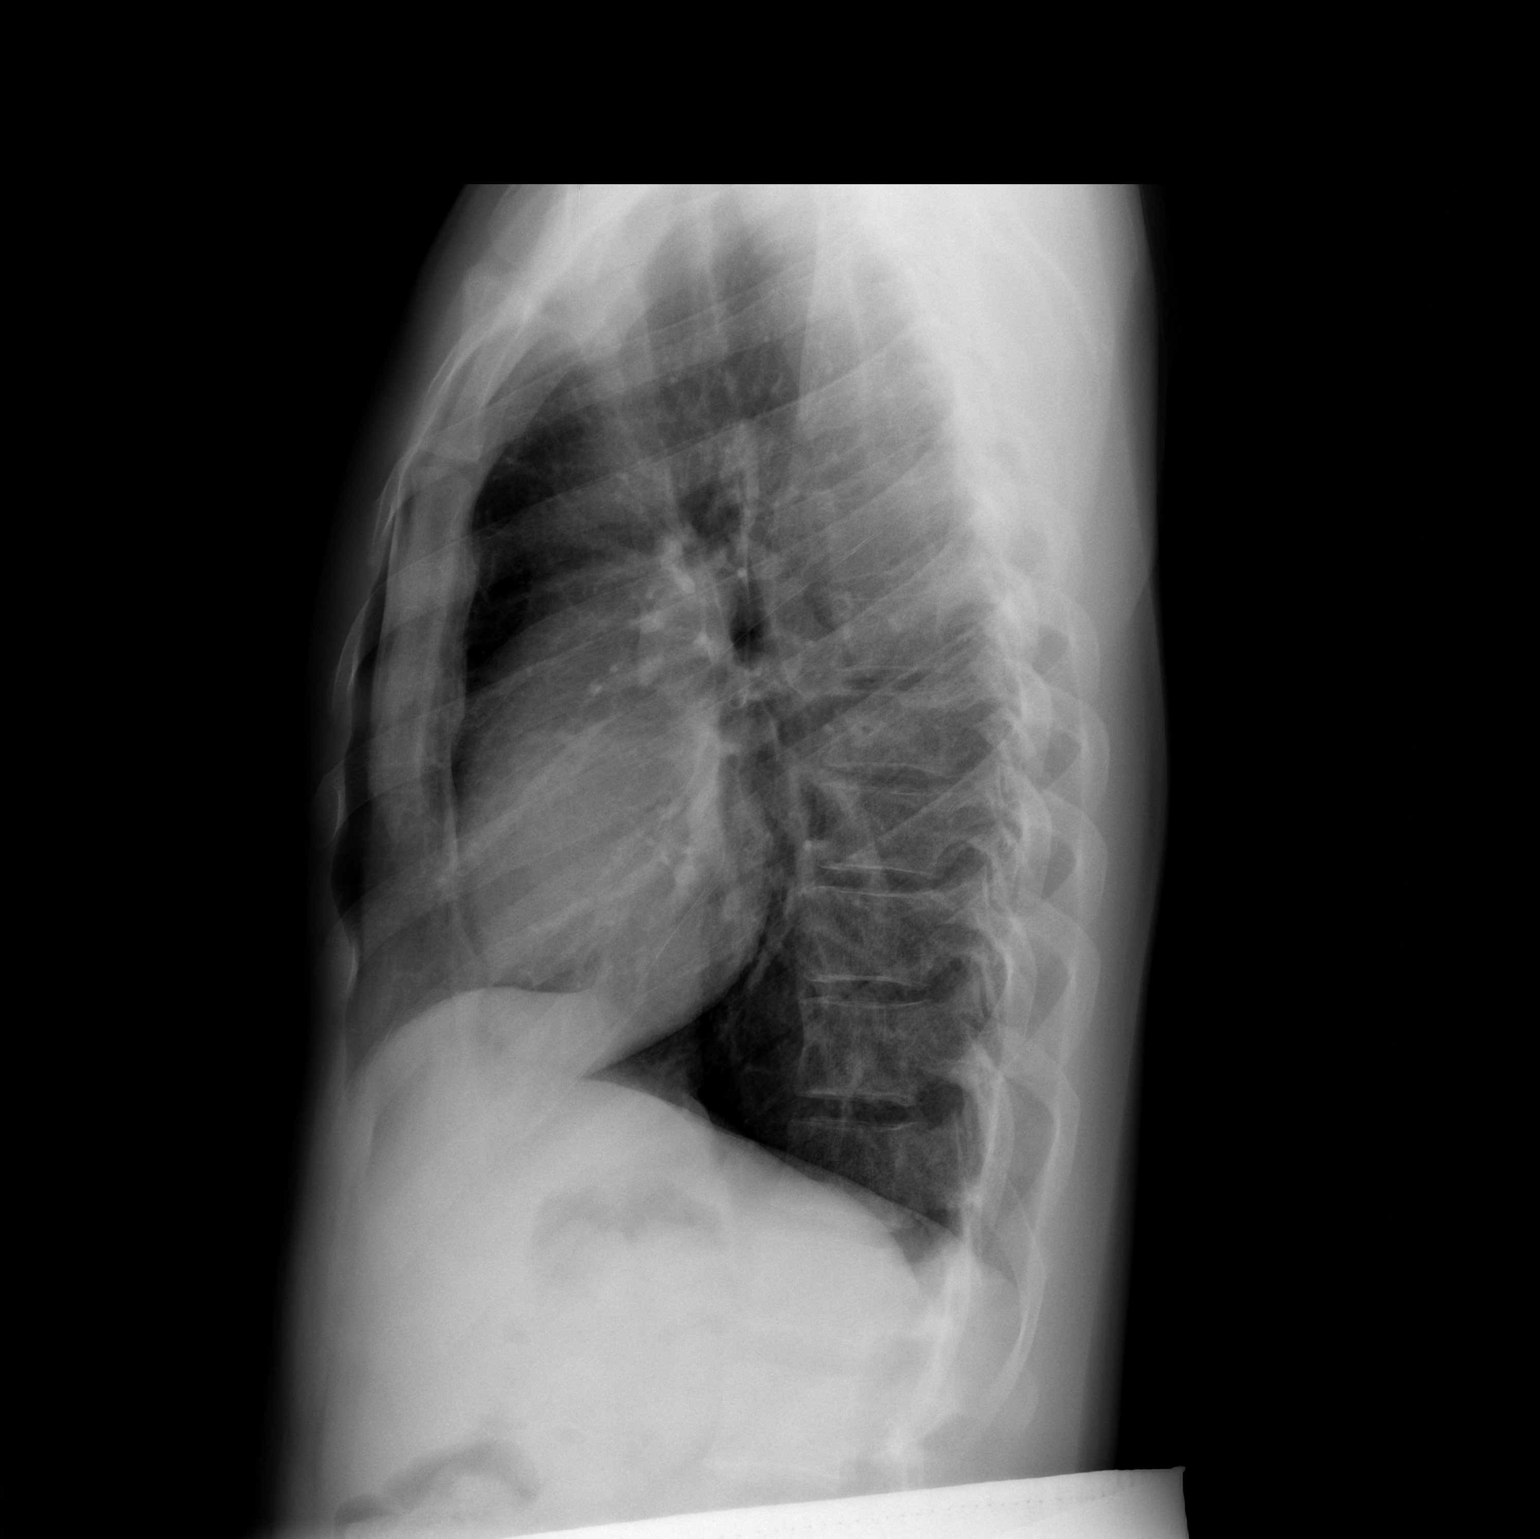

[2 of 2 positions shown; findings below may reference images not displayed]

FINDINGS: The lungs are clear without focal consolidation, edema,
effusion or pneumothorax.  Cardiopericardial silhouette is within
normal limits for size.  Imaged bony structures of the thorax are
intact.
IMPRESSION: No acute cardiopulmonary process.

## 2012-07-06 ENCOUNTER — Emergency Department (HOSPITAL_COMMUNITY)
Admission: EM | Admit: 2012-07-06 | Discharge: 2012-07-06 | Disposition: A | Discharge status: Home / Self Care | Payer: Self-pay | Attending: Emergency Medicine | Admitting: Emergency Medicine

## 2012-07-06 DIAGNOSIS — M549 Dorsalgia, unspecified: Secondary | ICD-10-CM | POA: Insufficient documentation

## 2012-07-06 DIAGNOSIS — G8929 Other chronic pain: Secondary | ICD-10-CM | POA: Insufficient documentation

## 2012-07-06 MED ORDER — MELOXICAM 7.5 MG PO TABS: 15.0000 mg | ORAL_TABLET | Freq: Every day | ORAL | Status: DC

## 2012-07-06 MED ORDER — ESOMEPRAZOLE MAGNESIUM 40 MG PO CPDR: 40.0000 mg | DELAYED_RELEASE_CAPSULE | Freq: Every day | ORAL | Status: DC

## 2012-07-06 MED ORDER — TRAMADOL HCL 50 MG PO TABS: 50.0000 mg | ORAL_TABLET | Freq: Four times a day (QID) | ORAL | Status: DC | PRN

## 2012-07-06 NOTE — ED Provider Notes (Signed)
History     CSN: [id number]  Arrival date & time 07/06/12  1802   First MD Initiated Contact with Patient 07/06/12 1857      Chief Complaint  Patient presents with  . Back Pain    (Consider location/radiation/quality/duration/timing/severity/associated sxs/prior treatment) HPI Patient with PMH of chronic lumbar pain presents with cc pain exacerbation.  Patient was seen at [facility] orthopedics and given meloxicam 7.5 twice a day with without relief.  Patient states he was evaluated previously and [place] and told that he has some disc problems in his lumbar spine.  Patient is currently working a job that includes heavy lifting and pain has increased the onset of his position.  Patient denies weakness or difficulty walking.  He denies any shooting pains down the front or back of his legs.Denies weakness, loss of bowel/bladder function or saddle anesthesia. Denies neck stiffness, headache, rash.  Denies fever or recent procedures to back.   Denies fevers, chills, myalgias, arthralgias. Denies DOE, SOB, chest tightness or pressure, radiation to left arm, jaw or back, or diaphoresis. Denies dysuria, flank pain, suprapubic pain, frequency, urgency, or hematuria. Denies headaches, light headedness, weakness, visual disturbances. Denies abdominal pain, nausea, vomiting, diarrhea or constipation.    No past medical history on file.  No past surgical history on file.  No family history on file.  History  Substance Use Topics  . Smoking status: Not on file  . Smokeless tobacco: Not on file  . Alcohol Use: Not on file      Review of Systems Ten systems are reviewed and are negative for acute change except as noted in the HPI  Allergies  Review of patient's allergies indicates no known allergies.  Home Medications   Current Outpatient Rx  Name  Route  Sig  Dispense  Refill  . ESOMEPRAZOLE MAGNESIUM 40 MG PO CPDR   Oral   Take 1 capsule (40 mg total) by mouth daily.   30 capsule   0   . MELOXICAM 7.5 MG PO TABS   Oral   Take 2 tablets (15 mg total) by mouth daily.   10 tablet   0   . TRAMADOL HCL 50 MG PO TABS   Oral   Take 1 tablet (50 mg total) by mouth every 6 (six) hours as needed for pain.   30 tablet   0     BP 131/96  Pulse 82  Temp 97.9 F (36.6 C) (Oral)  Resp 16  SpO2 100%  Physical Exam Physical Exam  Nursing note and vitals reviewed. Constitutional: He appears well-developed and well-nourished. No distress.  HENT:  Head: Normocephalic and atraumatic.  Eyes: Conjunctivae normal are normal. No scleral icterus.  Neck: Normal range of motion. Neck supple.  Cardiovascular: Normal rate, regular rhythm and normal heart sounds.   Pulmonary/Chest: Effort normal and breath sounds normal. No respiratory distress.  Abdominal: Soft. There is no tenderness.  Musculoskeletal: He exhibits no edema. No ecchymosis or deformity.  Patient appears to have mild curvature of the thoracolumbar junction.  Range of motion especially with twisting is limited due to pain.  He is nontender to palpation of the spinous processes.  He is tender to palpation of the lumbar paraspinals.  No SI joint tenderness or gluteal tenderness.  Negative straight leg raise bilaterally.   Neurological: He is alert.  Skin: Skin is warm and dry. He is not diaphoretic.  Psychiatric: His behavior is normal.     ED Course  Procedures (including critical care  time)  Labs Reviewed - No data to display No results found.   1. Chronic back pain       MDM  Patient with chronic musculoskeletal back pain.  He has no neurologic deficits on physical exam.  He may have some scoliosis contributing to his chronic back pain especially with exercise.  Have given the patient tramadol and have changed his meloxicam prescription.  Patient is also compared plenty of some gastric irritation have given him some Nexium to treat.  Patient will followup with Dr. Carola Frost I have advised physical therapy  and back exercises for the patient.  And instructed patient on proper lifting technique. Discussed reasons to seek immediate care. Patient expresses understanding and agrees with plan.         Arthor Captain, PA-C 07/06/12 2039

## 2012-07-06 NOTE — ED Provider Notes (Signed)
Medical screening examination/treatment/procedure(s) were performed by non-physician practitioner and as supervising physician I was immediately available for consultation/collaboration.  Jymir Dunaj, MD 07/06/12 2359 

## 2012-07-06 NOTE — ED Notes (Signed)
Pt drove himself here to ED.

## 2012-07-06 NOTE — ED Notes (Signed)
Pt has hx of back pain. Pt states pain is now in his lower back and is worse because of his work. Pt ambulatory to exam room from triage. Pt states he is taking meloxicam 7.5mg  for pain, but it's not helping. Last dose this afternoon.

## 2012-08-01 ENCOUNTER — Encounter (HOSPITAL_COMMUNITY): Payer: Self-pay | Admitting: Emergency Medicine

## 2012-08-01 ENCOUNTER — Emergency Department (HOSPITAL_COMMUNITY)
Admission: EM | Admit: 2012-08-01 | Discharge: 2012-08-01 | Disposition: A | Discharge status: Home / Self Care | Payer: Self-pay | Attending: Emergency Medicine | Admitting: Emergency Medicine

## 2012-08-01 DIAGNOSIS — M545 Low back pain, unspecified: Secondary | ICD-10-CM | POA: Insufficient documentation

## 2012-08-01 DIAGNOSIS — G8929 Other chronic pain: Secondary | ICD-10-CM | POA: Insufficient documentation

## 2012-08-01 DIAGNOSIS — M549 Dorsalgia, unspecified: Secondary | ICD-10-CM

## 2012-08-01 DIAGNOSIS — Z79899 Other long term (current) drug therapy: Secondary | ICD-10-CM | POA: Insufficient documentation

## 2012-08-01 DIAGNOSIS — Z760 Encounter for issue of repeat prescription: Secondary | ICD-10-CM | POA: Insufficient documentation

## 2012-08-01 MED ORDER — MELOXICAM 7.5 MG PO TABS: 7.5000 mg | ORAL_TABLET | Freq: Every day | ORAL | Status: DC

## 2012-08-01 MED ORDER — MELOXICAM 7.5 MG PO TABS
7.5000 mg | ORAL_TABLET | Freq: Once | ORAL | Status: AC
  Administered 2012-08-01: 7.5 mg via ORAL
  Filled 2012-08-01 (×2): qty 1

## 2012-08-01 NOTE — ED Provider Notes (Signed)
History   This chart was scribed for non-physician practitioner working with Rolan Bucco, MD by Gerlean Ren, ED Scribe. This patient was seen in room WTR5/WTR5 and the patient's care was started at 6:08 PM.    CSN: [id number]  Arrival date & time 08/01/12  1737   First MD Initiated Contact with Patient 08/01/12 1742      No chief complaint on file.   The history is provided by the patient. No language interpreter was used.   Allen Ramos is a 37 y.o. male with h/o chronic lower back pain who presents to the Emergency Department complaining of constant, non-worsening, moderate, sharp, non-radiating lower back pain that is currently lower than h/o chronic back pain.  Pain is no worse than chronic pain, but pt is out of his pain medication.  Pain is worsened by exertion and bending over and has no improving factors.  Pt was seen here 12/12 for same pain and was sent to Dr. Carola Frost but was unable to get an appointment or fill pain prescription from Mobic given here because he lost his job.  Pt denies weakness or numbness in legs, incontinence, fever, IV drug use.    No past medical history on file.  No past surgical history on file.  No family history on file.  History  Substance Use Topics  . Smoking status: Not on file  . Smokeless tobacco: Not on file  . Alcohol Use: Not on file      Review of Systems  Musculoskeletal: Positive for back pain.  Neurological: Negative for weakness and numbness.  All other systems reviewed and are negative.    Allergies  Review of patient's allergies indicates no known allergies.  Home Medications   Current Outpatient Rx  Name  Route  Sig  Dispense  Refill  . ESOMEPRAZOLE MAGNESIUM 40 MG PO CPDR   Oral   Take 1 capsule (40 mg total) by mouth daily.   30 capsule   0   . MELOXICAM 7.5 MG PO TABS   Oral   Take 2 tablets (15 mg total) by mouth daily.   10 tablet   0   . TRAMADOL HCL 50 MG PO TABS   Oral   Take 1 tablet (50 mg total)  by mouth every 6 (six) hours as needed for pain.   30 tablet   0   . MELOXICAM 7.5 MG PO TABS   Oral   Take 1 tablet (7.5 mg total) by mouth daily.   30 tablet   1     BP 130/80  Pulse 81  Temp 98.1 F (36.7 C)  Resp 20  Wt 152 lb (68.947 kg)  SpO2 99%  Physical Exam  Nursing note and vitals reviewed. Constitutional: He is oriented to person, place, and time. He appears well-developed and well-nourished. No distress.  HENT:  Head: Normocephalic and atraumatic.  Eyes: EOM are normal.  Neck: Neck supple. No tracheal deviation present.  Cardiovascular: Normal rate.   Pulmonary/Chest: Effort normal. No respiratory distress.  Musculoskeletal: Normal range of motion.       Back:        Equal strength to bilateral lower extremities. Neurosensory  function adequate to both legs. Skin color is normal. Skin is warm and moist. I see no step off deformity, no bony tenderness. Pt is able to ambulate without limp. Pain is relieved when sitting in certain positions. ROM is decreased due to pain. No crepitus, laceration, effusion, swelling.  Pulses are normal  Neurological: He is alert and oriented to person, place, and time.  Skin: Skin is warm and dry.  Psychiatric: He has a normal mood and affect. His behavior is normal.    ED Course  Procedures (including critical care time) DIAGNOSTIC STUDIES: Oxygen Saturation is 99% on room air, normal by my interpretation.    COORDINATION OF CARE: 6:12 PM- Patient informed of clinical course, understands medical decision-making process, and agrees with plan.  Provided pt with on-call orthopedist information.  Ordered PO Mobic at bedside and refill for Mobic prescription.  Labs Reviewed - No data to display No results found.   1. Back pain       MDM  Discussed with patient that Mobic is on the [employer] $4 list. Pt advised that he really needs to see ORtho.  Patient with back pain. No neurological deficits. Patient is ambulatory. No  warning symptoms of back pain including: loss of bowel or bladder control, night sweats, waking from sleep with back pain, unexplained fevers or weight loss, h/o cancer, IVDU, recent trauma. No concern for cauda equina, epidural abscess, or other serious cause of back pain. Conservative measures such as rest, ice/heat and pain medicine indicated with PCP follow-up if no improvement with conservative management.    I personally performed the services described in this documentation, which was scribed in my presence. The recorded information has been reviewed and is accurate.        Dorthula Matas, PA 08/02/12 1257

## 2012-08-01 NOTE — ED Notes (Signed)
Patient was seen here on Dec. 12, 2013 for chronic back pain.  He was sent to a Dr. Carola Frost but was unable to get an appointment.  Pt continues with his pain. Did not get his pain prescription filled because he lost his job and did not have the money.  Pt rates pain 8/10.

## 2012-08-03 NOTE — ED Provider Notes (Signed)
Medical screening examination/treatment/procedure(s) were performed by non-physician practitioner and as supervising physician I was immediately available for consultation/collaboration.   Rolan Bucco, MD 08/03/12 [phone]

## 2012-08-06 ENCOUNTER — Other Ambulatory Visit: Payer: Self-pay | Admitting: Specialist

## 2012-08-06 ENCOUNTER — Other Ambulatory Visit: Payer: Self-pay

## 2012-08-06 ENCOUNTER — Ambulatory Visit
Admission: RE | Admit: 2012-08-06 | Discharge: 2012-08-06 | Disposition: A | Discharge status: Home / Self Care | Payer: No Typology Code available for payment source | Source: Ambulatory Visit | Attending: Specialist | Admitting: Specialist

## 2012-08-06 DIAGNOSIS — M549 Dorsalgia, unspecified: Secondary | ICD-10-CM

## 2012-11-18 ENCOUNTER — Encounter: Payer: Self-pay | Admitting: Neurology

## 2012-11-18 ENCOUNTER — Ambulatory Visit (INDEPENDENT_AMBULATORY_CARE_PROVIDER_SITE_OTHER): Payer: BC Managed Care – PPO | Admitting: Neurology

## 2012-11-18 VITALS — BP 117/78 | HR 73 | Ht 70.0 in | Wt 155.0 lb

## 2012-11-18 DIAGNOSIS — M549 Dorsalgia, unspecified: Secondary | ICD-10-CM

## 2012-11-18 NOTE — Progress Notes (Signed)
Allen Ramos is a 37 yo male, immigrant from [place], referred by his primary care physician for evaluation of chronic low back pain   He complains long-standing history of low back pain, since 1993, there was no clear trigger then, he open felt bilateral paraspinal muscle pain with prolonged sitting, he initially thought that he had a kidney disease, check out was normal.  He later also had upper back pain, getting worse when bending down his neck, he denies gait difficulty, no bowel and bladder incontinence, no bilateral lower extremity sensory changes,  He did some heavy lifting job up to 50 pounds over the past 3 years, had worsening low-back pain, had MRI of the thoracic spine in January 2013, which has demonstrated a mild herniated disc between T12 and L1, there is no canal, and the foraminal stenosis, We also reviewed MRI of thoracic spine in 2008, continued evidence of mild dry disc, small herniation between T12 and L1, no significant change,  He now felt constant low back achy pain, 8 out of 10, toothache like there was no shooting pain into bilateral lower extremity He feel that his back in not straight. Sleep on his right side, he feels better, if he sleep on his left side, he feels worsening low back pain  Review of Systems  Out of a complete 14 system review, the patient complains of only the following symptoms, and all other reviewed systems are negative.   Constitutional:   N/A Cardiovascular:  N/A Ear/Nose/Throat:  N/A Skin: N/A Eyes: N/A Respiratory: N/A Gastroitestinal: N/A    Hematology/Lymphatic:  N/A Endocrine:  N/A Musculoskeletal:N/A Allergy/Immunology: N/A Neurological: N/A Psychiatric:    N/A  PHYSICAL EXAMINATOINS:  Generalized: In no acute distress  Neck: Supple, no carotid bruits   Cardiac: Regular rate rhythm  Pulmonary: Clear to auscultation bilaterally  Musculoskeletal: No deformity  Neurological examination  Mentation: Alert oriented to time,  place, history taking, and causual conversation  Cranial nerve II-XII: Pupils were equal round reactive to light extraocular movements were full, visual field were full on confrontational test. facial sensation and strength were normal. hearing was intact to finger rubbing bilaterally. Uvula tongue midline.  head turning and shoulder shrug and were normal and symmetric.Tongue protrusion into cheek strength was normal.  Motor: normal tone, bulk and strength.  Sensory: Intact to fine touch, pinprick, preserved vibratory sensation, and proprioception at toes.  Coordination: Normal finger to nose, heel-to-shin bilaterally there was no truncal ataxia  Gait: Rising up from seated position without assistance, normal stance, without trunk ataxia, moderate stride, good arm swing, smooth turning, able to perform tiptoe, and heel walking without difficulty.   Romberg signs: Negative  Deep tendon reflexes: Brachioradialis 2/2, biceps 2/2, triceps 2/2, patellar 2/2, Achilles 2/2, plantar responses were flexor bilaterally.  Assessment and Plan: 37 yo male with chronic low back pain, normal neurological examination now.  1. Most likely musculoskeleton etiology. 2. Physical therapy, hot compression. 3. RTC in 6 months

## 2012-11-27 ENCOUNTER — Telehealth: Payer: Self-pay

## 2012-11-27 NOTE — Telephone Encounter (Signed)
Pt called in requesting to know about orthopaedics status. Returned call, no answer.

## 2013-05-20 ENCOUNTER — Ambulatory Visit: Payer: BC Managed Care – PPO | Admitting: Nurse Practitioner

## 2015-02-16 ENCOUNTER — Encounter: Payer: Self-pay | Admitting: Gastroenterology

## 2015-05-21 ENCOUNTER — Encounter (HOSPITAL_COMMUNITY): Payer: Self-pay | Admitting: Emergency Medicine

## 2015-05-21 ENCOUNTER — Emergency Department (HOSPITAL_COMMUNITY)
Admission: EM | Admit: 2015-05-21 | Discharge: 2015-05-21 | Discharge status: Against Medical Advice | Payer: 59 | Attending: Emergency Medicine | Admitting: Emergency Medicine

## 2015-05-21 DIAGNOSIS — Z87891 Personal history of nicotine dependence: Secondary | ICD-10-CM | POA: Insufficient documentation

## 2015-05-21 DIAGNOSIS — F329 Major depressive disorder, single episode, unspecified: Secondary | ICD-10-CM | POA: Insufficient documentation

## 2015-05-21 DIAGNOSIS — F32A Depression, unspecified: Secondary | ICD-10-CM

## 2015-05-21 NOTE — ED Notes (Signed)
Computer froze, pt stood up and said I'm leaving.

## 2015-05-21 NOTE — ED Notes (Signed)
Patient stated if he stay in his situation that it is no good. Patient would not elaborate on what he was talking about. Patient states that he dont want to hurt himself or hurt anyone else. Patient stated he just want medicine to sleep.

## 2015-05-21 NOTE — ED Provider Notes (Signed)
0210 - Patient LWBS after triage, per nursing notes. Patient had presented for depression. He made no statements to nursing or in triage to suggest SI/HI. Patient was not seen or examined by this Clinical research associatewriter.  Antony MaduraKelly Vernal Rutan, PA-C 05/21/15 [phone]  Loren Raceravid Yelverton, MD 05/22/15 [phone]

## 2015-05-21 NOTE — ED Notes (Signed)
Patient walked out. Patient was asked was he leaving, and he kept on walking.

## 2015-05-21 NOTE — ED Notes (Signed)
Pt stated "I have a situation @ home.  I tried to call my dad back home.  I'm depresssed."  Questioned if pt wanted to harm self or others.  Pt denied.

## 2016-11-20 ENCOUNTER — Encounter (HOSPITAL_BASED_OUTPATIENT_CLINIC_OR_DEPARTMENT_OTHER): Payer: Self-pay

## 2016-11-20 DIAGNOSIS — J45909 Unspecified asthma, uncomplicated: Secondary | ICD-10-CM | POA: Insufficient documentation

## 2016-11-20 DIAGNOSIS — Z87891 Personal history of nicotine dependence: Secondary | ICD-10-CM | POA: Insufficient documentation

## 2016-11-20 NOTE — ED Triage Notes (Signed)
Reports severe headache x 3 weeks.  Denies vision changes n/v or dizziness.

## 2016-11-21 ENCOUNTER — Emergency Department (HOSPITAL_BASED_OUTPATIENT_CLINIC_OR_DEPARTMENT_OTHER): Payer: Self-pay

## 2016-11-21 ENCOUNTER — Emergency Department (HOSPITAL_BASED_OUTPATIENT_CLINIC_OR_DEPARTMENT_OTHER)
Admission: EM | Admit: 2016-11-21 | Discharge: 2016-11-21 | Disposition: A | Discharge status: Home / Self Care | Payer: Self-pay | Attending: Emergency Medicine | Admitting: Emergency Medicine

## 2016-11-21 ENCOUNTER — Encounter (HOSPITAL_BASED_OUTPATIENT_CLINIC_OR_DEPARTMENT_OTHER): Payer: Self-pay | Admitting: Emergency Medicine

## 2016-11-21 DIAGNOSIS — R519 Headache, unspecified: Secondary | ICD-10-CM

## 2016-11-21 DIAGNOSIS — J45909 Unspecified asthma, uncomplicated: Secondary | ICD-10-CM

## 2016-11-21 DIAGNOSIS — R51 Headache: Secondary | ICD-10-CM

## 2016-11-21 MED ORDER — LORATADINE 10 MG PO TABS
10.0000 mg | ORAL_TABLET | Freq: Once | ORAL | Status: AC
  Administered 2016-11-21: 10 mg via ORAL
  Filled 2016-11-21: qty 1

## 2016-11-21 MED ORDER — DEXAMETHASONE SODIUM PHOSPHATE 10 MG/ML IJ SOLN
10.0000 mg | Freq: Once | INTRAMUSCULAR | Status: AC
  Administered 2016-11-21: 10 mg via INTRAMUSCULAR
  Filled 2016-11-21: qty 1

## 2016-11-21 MED ORDER — IBUPROFEN 800 MG PO TABS: 800.0000 mg | ORAL_TABLET | Freq: Three times a day (TID) | ORAL | 0 refills | Status: DC

## 2016-11-21 MED ORDER — KETOROLAC TROMETHAMINE 60 MG/2ML IM SOLN
60.0000 mg | Freq: Once | INTRAMUSCULAR | Status: AC
  Administered 2016-11-21: 60 mg via INTRAMUSCULAR
  Filled 2016-11-21: qty 2

## 2016-11-21 MED ORDER — LORATADINE 10 MG PO TABS: 10.0000 mg | ORAL_TABLET | Freq: Every day | ORAL | 0 refills | Status: DC

## 2016-11-21 NOTE — ED Notes (Signed)
Patient transported to CT 

## 2016-11-21 NOTE — ED Provider Notes (Signed)
[facility] Provider Note   CSN: [id number] Arrival date & time: 11/20/16  1926  By signing my name below, I, Teofilo Pod, attest that this documentation has been prepared under the direction and in the presence of Norberto Wishon, MD . Electronically Signed: Teofilo Pod, ED Scribe. 11/21/2016. 12:28 AM.    History   Chief Complaint Chief Complaint  Patient presents with  . Headache   The history is provided by the patient. No language interpreter was used.  Headache   This is a new problem. The current episode started more than 1 week ago. The problem occurs constantly. The problem has not changed since onset.The headache is associated with nothing. The pain is located in the left unilateral region. The pain is moderate. The pain does not radiate. Pertinent negatives include no anorexia, no fever, no malaise/fatigue, no chest pressure, no near-syncope, no orthopnea, no palpitations, no syncope, no shortness of breath, no nausea and no vomiting. He has tried NSAIDs for the symptoms. The treatment provided no relief.   HPI Comments:  Allen Ramos is a 41 y.o. male who presents to the Emergency Department complaining of an ongoing headache x 3 weeks. He states that the pain is primarily "inside his head." He denies any injury/trauma. He does not have a PCP at this time. Pt took Advil yesterday with no relief. Denies visual changes, dizziness, ear pain, rhinorrhea, congestion, eye itching. .   Past Medical History:  Diagnosis Date  . Back pain     Patient Active Problem List   Diagnosis Date Noted  . Back pain   . ANEMIA, VITAMIN B12 DEFICIENCY 09/01/2009  . POSITIVE PPD 08/05/2009  . BACK PAIN 08/02/2009  . SKIN RASH 08/02/2009  . ABDOMINAL PAIN 08/02/2009    History reviewed. No pertinent surgical history.     Home Medications    Prior to Admission medications   Not on File    Family History Family History  Problem Relation Age of Onset  .  Hypertension Father     Social History Social History  Substance Use Topics  . Smoking status: Former Games developer  . Smokeless tobacco: Never Used     Comment: Quit five years ago  . Alcohol use No     Allergies   Patient has no known allergies.   Review of Systems Review of Systems  Constitutional: Negative for fever and malaise/fatigue.  HENT: Negative for congestion, ear pain and rhinorrhea.   Eyes: Negative for itching and visual disturbance.  Respiratory: Negative for shortness of breath.   Cardiovascular: Negative for palpitations, orthopnea, syncope and near-syncope.  Gastrointestinal: Negative for anorexia, nausea and vomiting.  Musculoskeletal: Negative for neck pain and neck stiffness.  Neurological: Positive for headaches. Negative for dizziness, tremors, seizures, syncope, facial asymmetry, speech difficulty, weakness, light-headedness and numbness.  All other systems reviewed and are negative.    Physical Exam Updated Vital Signs BP (!) 147/90 (BP Location: Left Arm)   Pulse 77   Temp 98.4 F (36.9 C) (Oral)   Resp 16   Ht  (1.727 m)   Wt 162 lb (73.5 kg)   SpO2 100%   BMI 24.63 kg/m   Physical Exam  Constitutional: He is oriented to person, place, and time. He appears well-developed and well-nourished. No distress.  HENT:  Head: Normocephalic and atraumatic.  Right Ear: Tympanic membrane normal.  Left Ear: Tympanic membrane normal.  Mouth/Throat: Oropharynx is clear and moist. No oropharyngeal exudate.  Trachea midline. Postnasal  drip noted. Boggy nasal turbinates . Intact cognitions  Eyes: Conjunctivae and EOM are normal. Pupils are equal, round, and reactive to light.  No proptosis  Neck: Trachea normal and normal range of motion. Neck supple. No JVD present. Carotid bruit is not present.  Cardiovascular: Normal rate, regular rhythm, normal heart sounds and intact distal pulses.  Exam reveals no gallop and no friction rub.   No murmur  heard. Pulmonary/Chest: Effort normal and breath sounds normal. No stridor. He has no wheezes. He has no rales.  Abdominal: Soft. Bowel sounds are normal. He exhibits no mass. There is no tenderness. There is no rebound and no guarding.  Musculoskeletal: Normal range of motion. He exhibits no deformity.  Lymphadenopathy:    He has no cervical adenopathy.  Neurological: He is alert and oriented to person, place, and time. He has normal reflexes. He displays normal reflexes (5/5, 2+ throughout). No cranial nerve deficit or sensory deficit. He exhibits normal muscle tone. Coordination normal.  Cranial nerves 2-12 intact. 5/5 strength in upper extremities.  Skin: Skin is warm and dry. Capillary refill takes less than 2 seconds. He is not diaphoretic.  Psychiatric: He has a normal mood and affect. His behavior is normal.     ED Treatments / Results   Vitals:   11/20/16 1930 11/20/16 2225  BP: (!) 158/98 (!) 147/90  Pulse: 85 77  Resp: 18 16  Temp: 98.5 F (36.9 C) 98.4 F (36.9 C)    DIAGNOSTIC STUDIES: Oxygen Saturation is 100% on RA, normal by my interpretation.    COORDINATION OF CARE: 12:28 AM Discussed treatment plan with pt at bedside and pt agreed to plan.     Procedures Procedures (including critical care time)  Medications Ordered in ED  Medications  ketorolac (TORADOL) injection 60 mg (60 mg Intramuscular Given 11/21/16 0102)  dexamethasone (DECADRON) injection 10 mg (10 mg Intramuscular Given 11/21/16 0102)  loratadine (CLARITIN) tablet 10 mg (10 mg Oral Given 11/21/16 0101)     Final Clinical Impressions(s) / ED Diagnoses  Headache:  Well appearing no neurologic deficits.  No signs of infection.  I suspect allergies.  Follow up with your PMD. Exam and vitals are benign and reassuring. Return for fevers, neck pain, weakness numbness changes in vision or speech, or shortness of breath, hemoptysis, intractable vomiting or pain or any concerns. Patient is extremely  well appearing and exam and imaging are negative for acute finding.    After history, exam, and medical workup I feel the patient has been appropriately medically screened and is safe for discharge home. Pertinent diagnoses were discussed with the patient. Patient was given return precautions.  I personally performed the services described in this documentation, which was scribed in my presence. The recorded information has been reviewed and is accurate.      Cy Blamer, MD 11/21/16 [phone]

## 2017-07-16 ENCOUNTER — Encounter (HOSPITAL_COMMUNITY): Payer: Self-pay | Admitting: Emergency Medicine

## 2017-07-16 ENCOUNTER — Emergency Department (HOSPITAL_COMMUNITY)
Admission: EM | Admit: 2017-07-16 | Discharge: 2017-07-16 | Disposition: A | Discharge status: Home / Self Care | Payer: BLUE CROSS/BLUE SHIELD | Attending: Emergency Medicine | Admitting: Emergency Medicine

## 2017-07-16 ENCOUNTER — Emergency Department (HOSPITAL_COMMUNITY): Payer: BLUE CROSS/BLUE SHIELD

## 2017-07-16 ENCOUNTER — Other Ambulatory Visit: Payer: Self-pay

## 2017-07-16 ENCOUNTER — Ambulatory Visit: Payer: Self-pay | Admitting: *Deleted

## 2017-07-16 DIAGNOSIS — Z87891 Personal history of nicotine dependence: Secondary | ICD-10-CM | POA: Diagnosis not present

## 2017-07-16 DIAGNOSIS — R079 Chest pain, unspecified: Secondary | ICD-10-CM | POA: Diagnosis present

## 2017-07-16 DIAGNOSIS — I10 Essential (primary) hypertension: Secondary | ICD-10-CM | POA: Diagnosis not present

## 2017-07-16 LAB — BASIC METABOLIC PANEL
Anion gap: 8 (ref 5–15)
BUN: 18 mg/dL (ref 6–20)
CALCIUM: 9.2 mg/dL (ref 8.9–10.3)
CO2: 29 mmol/L (ref 22–32)
CREATININE: 1.1 mg/dL (ref 0.61–1.24)
Chloride: 98 mmol/L — ABNORMAL LOW (ref 101–111)
GFR calc non Af Amer: >60 mL/min (ref >60)
Glucose, Bld: 112 mg/dL — ABNORMAL HIGH (ref 65–99)
Potassium: 3.6 mmol/L (ref 3.5–5.1)
SODIUM: 135 mmol/L (ref 135–145)

## 2017-07-16 LAB — CBC
HCT: 39.4 % (ref 39.0–52.0)
Hemoglobin: 13.2 g/dL (ref 13.0–17.0)
MCH: 31.1 pg (ref 26.0–34.0)
MCHC: 33.5 g/dL (ref 30.0–36.0)
MCV: 92.7 fL (ref 78.0–100.0)
PLATELETS: 309 10*3/uL (ref 150–400)
RBC: 4.25 MIL/uL (ref 4.22–5.81)
RDW: 13.6 % (ref 11.5–15.5)
WBC: 6.1 10*3/uL (ref 4.0–10.5)

## 2017-07-16 LAB — I-STAT CHEM 8, ED
BUN: 16 mg/dL (ref 6–20)
CREATININE: 1 mg/dL (ref 0.61–1.24)
Calcium, Ion: 1.16 mmol/L (ref 1.15–1.40)
Chloride: 98 mmol/L — ABNORMAL LOW (ref 101–111)
Glucose, Bld: 98 mg/dL (ref 65–99)
HEMATOCRIT: 45 % (ref 39.0–52.0)
HEMOGLOBIN: 15.3 g/dL (ref 13.0–17.0)
Potassium: 4.1 mmol/L (ref 3.5–5.1)
Sodium: 139 mmol/L (ref 135–145)
TCO2: 30 mmol/L (ref 22–32)

## 2017-07-16 LAB — I-STAT TROPONIN, ED: TROPONIN I, POC: 0 ng/mL (ref 0.00–0.08)

## 2017-07-16 MED ORDER — LISINOPRIL 10 MG PO TABS: 10.0000 mg | ORAL_TABLET | Freq: Every day | ORAL | 1 refills | Status: DC

## 2017-07-16 MED ORDER — LISINOPRIL 10 MG PO TABS
10.0000 mg | ORAL_TABLET | Freq: Once | ORAL | Status: AC
  Administered 2017-07-16: 10 mg via ORAL
  Filled 2017-07-16: qty 1

## 2017-07-16 NOTE — ED Notes (Signed)
Bed: WA23 Expected date:  Expected time:  Means of arrival:  Comments: triage 

## 2017-07-16 NOTE — ED Triage Notes (Signed)
Pt sent by nurse at work related to high blood pressure. Pt verbalizes was seeing nurse related to nonproductive cough. With triage pt verbalizes ongoing left chest pain for 3 days.

## 2017-07-16 NOTE — Telephone Encounter (Signed)
Patient wanted to schedule an appointment- due to the current BP readings- advised patient to go to ED now. Told patient he can call back to schedule appointment for follow up later.  Reason for Disposition . [1] Systolic BP  >= 160 OR Diastolic >= 100 AND [2] cardiac or neurologic symptoms (e.g., chest pain, difficulty breathing, unsteady gait, blurred vision)  Answer Assessment - Initial Assessment Questions 1. BLOOD PRESSURE: "What is the blood pressure?" "Did you take at least two measurements 5 minutes apart?"     152/104   145/101- 2 hours later 167/101 2. ONSET: "When did you take your blood pressure?"     Today by nurse at work 3. HOW: "How did you obtain the blood pressure?" (e.g., visiting nurse, automatic home BP monitor)     Did not ask 4. HISTORY: "Do you have a history of high blood pressure?"     No medication for blood pressure 5. MEDICATIONS: "Are you taking any medications for blood pressure?" "Have you missed any doses recently?"     Patient does have cold and did take Alka seltzer Cold last night and this morning 6. OTHER SYMPTOMS: "Do you have any symptoms?" (e.g., headache, chest pain, blurred vision, difficulty breathing, weakness)     Headache, pain in left shoulder and chest 7. PREGNANCY: "Is there any chance you are pregnant?" "When was your last menstrual period?"     n/a  Protocols used: HIGH BLOOD PRESSURE-A-AH

## 2017-07-16 NOTE — ED Provider Notes (Signed)
[facility]-EMERGENCY DEPT Provider Note   CSN: [id number] Arrival date & time: 07/16/17  1318     History   Chief Complaint Chief Complaint  Patient presents with  . Hypertension  . Chest Pain    HPI Allen Ramos is a 41 y.o. male.  HPI Pt had some cough and congestion the last few days.  He went to the nurse at work for this.  They noticed that his blood pressure was elevated and he was sent to the ED.  Pt has not had his BP checked in a while.   No chest pain. No shortness of breath.  No history of heart or lung disease.  His father has HTN. Past Medical History:  Diagnosis Date  . Back pain     Patient Active Problem List   Diagnosis Date Noted  . Back pain   . ANEMIA, VITAMIN B12 DEFICIENCY 09/01/2009  . POSITIVE PPD 08/05/2009  . BACK PAIN 08/02/2009  . SKIN RASH 08/02/2009  . ABDOMINAL PAIN 08/02/2009    History reviewed. No pertinent surgical history.     Home Medications    Prior to Admission medications   Medication Sig Start Date End Date Taking? Authorizing Provider  lisinopril (PRINIVIL,ZESTRIL) 10 MG tablet Take 1 tablet (10 mg total) by mouth daily. 07/16/17   Linwood DibblesKnapp, Wyndham Santilli, MD  loratadine (CLARITIN) 10 MG tablet Take 1 tablet (10 mg total) by mouth daily. Patient not taking: Reported on 07/16/2017 11/21/16   Nicanor AlconPalumbo, April, MD    Family History Family History  Problem Relation Age of Onset  . Hypertension Father     Social History Social History   Tobacco Use  . Smoking status: Former Games developermoker  . Smokeless tobacco: Never Used  . Tobacco comment: Quit five years ago  Substance Use Topics  . Alcohol use: No  . Drug use: No     Allergies   Patient has no known allergies.   Review of Systems Review of Systems  All other systems reviewed and are negative.    Physical Exam Updated Vital Signs BP (!) 144/102   Pulse 64   Temp (!) 97.4 F (36.3 C) (Oral)   Resp 12   Ht 1.778 m (5\' 10" )   Wt 70.8 kg (156  lb)   SpO2 100%   BMI 22.38 kg/m   Physical Exam  Constitutional: He appears well-developed and well-nourished. No distress.  HENT:  Head: Normocephalic and atraumatic.  Right Ear: External ear normal.  Left Ear: External ear normal.  Eyes: Conjunctivae are normal. Right eye exhibits no discharge. Left eye exhibits no discharge. No scleral icterus.  Neck: Neck supple. No tracheal deviation present.  Cardiovascular: Normal rate, regular rhythm and intact distal pulses.  Pulmonary/Chest: Effort normal and breath sounds normal. No stridor. No respiratory distress. He has no wheezes. He has no rales.  Abdominal: Soft. Bowel sounds are normal. He exhibits no distension. There is no tenderness. There is no rebound and no guarding.  Musculoskeletal: He exhibits no edema or tenderness.  Neurological: He is alert. He has normal strength. No sensory deficit. Cranial nerve deficit: no gross deficits. He exhibits normal muscle tone. He displays no seizure activity. Coordination normal.  Skin: Skin is warm and dry. No rash noted.  Psychiatric: He has a normal mood and affect.  Nursing note and vitals reviewed.    ED Treatments / Results  Labs (all labs ordered are listed, but only abnormal results are displayed) Labs Reviewed  BASIC METABOLIC  PANEL - Abnormal; Notable for the following components:      Result Value   Chloride 98 (*)    Glucose, Bld 112 (*)    All other components within normal limits  I-STAT CHEM 8, ED - Abnormal; Notable for the following components:   Chloride 98 (*)    All other components within normal limits  CBC  I-STAT TROPONIN, ED    EKG  EKG Interpretation  Date/Time:  Monday July 16 2017 13:26:33 EST Ventricular Rate:  78 PR Interval:    QRS Duration: 112 QT Interval:  336 QTC Calculation: 383 R Axis:   6 Text Interpretation:  Sinus rhythm Incomplete right bundle branch block Abnormal T, consider ischemia, lateral leads No old tracing to compare  Confirmed by Linwood DibblesKnapp, Rochell Mabie [phone]) on 07/16/2017 9:44:13 PM       Radiology Dg Chest 2 View  Result Date: 07/16/2017 CLINICAL DATA:  Chest pain for 3 days EXAM: CHEST  2 VIEW COMPARISON:  06/18/2009 FINDINGS: The heart size and mediastinal contours are within normal limits. Both lungs are clear. The visualized skeletal structures are unremarkable. IMPRESSION: No active cardiopulmonary disease. Electronically Signed   By: Elige KoHetal  Patel   On: 07/16/2017 13:52    Procedures Procedures (including critical care time)  Medications Ordered in ED Medications  lisinopril (PRINIVIL,ZESTRIL) tablet 10 mg (10 mg Oral Given 07/16/17 2220)     Initial Impression / Assessment and Plan / ED Course  I have reviewed the triage vital signs and the nursing notes.  Pertinent labs & imaging results that were available during my care of the patient were reviewed by me and considered in my medical decision making (see chart for details).   Patient presented to the emergency room for evaluation of hypertension.  Patient denies seeing a doctor in a while.  His laboratory tests are reassuring.  EKG does show T wave inversion.  I suspect this is related to his hypertension and not acute cardiac ischemia.  His cardiac enzymes are normal.  He is not having any chest pain.  Plan on starting the patient on lisinopril.  Discussed close outpatient follow-up with primary care doctor.  Final Clinical Impressions(s) / ED Diagnoses   Final diagnoses:  Essential hypertension    ED Discharge Orders        Ordered    lisinopril (PRINIVIL,ZESTRIL) 10 MG tablet  Daily     07/16/17 2255       Linwood DibblesKnapp, Palmyra Rogacki, MD 07/16/17 2306

## 2017-07-16 NOTE — Discharge Instructions (Signed)
Start taking the medications for your blood pressure.  Follow-up with the primary care doctor.  Call to schedule appointment within the next week or 2

## 2017-07-19 ENCOUNTER — Ambulatory Visit (INDEPENDENT_AMBULATORY_CARE_PROVIDER_SITE_OTHER): Payer: BLUE CROSS/BLUE SHIELD | Admitting: Physician Assistant

## 2017-07-19 ENCOUNTER — Other Ambulatory Visit: Payer: Self-pay

## 2017-07-19 ENCOUNTER — Encounter: Payer: Self-pay | Admitting: Physician Assistant

## 2017-07-19 VITALS — BP 132/84 | HR 90 | Temp 98.6°F | Resp 18 | Ht 70.32 in | Wt 162.4 lb

## 2017-07-19 DIAGNOSIS — I10 Essential (primary) hypertension: Secondary | ICD-10-CM | POA: Diagnosis not present

## 2017-07-19 DIAGNOSIS — R9431 Abnormal electrocardiogram [ECG] [EKG]: Secondary | ICD-10-CM

## 2017-07-19 LAB — POCT URINALYSIS DIP (MANUAL ENTRY)
BILIRUBIN UA: NEGATIVE
BILIRUBIN UA: NEGATIVE mg/dL
Glucose, UA: NEGATIVE mg/dL
LEUKOCYTES UA: NEGATIVE
NITRITE UA: NEGATIVE
PH UA: 7.5 (ref 5.0–8.0)
PROTEIN UA: NEGATIVE mg/dL
Spec Grav, UA: 1.015 (ref 1.010–1.025)
Urobilinogen, UA: 0.2 E.U./dL

## 2017-07-19 NOTE — Progress Notes (Signed)
MRN: [id number] DOB: 08/12/1975  Subjective:   Allen Ramos Canny is a 41 y.o. male presenting for follow up on Hypertension. Was seen in ED for elevated BP on 07/16/17. Had no dx of HTN prior to this visit.  Labs were normal.  EKG showed incomplete RBBB and T wave inversion in inferior and lateral leads. He was started on lisnopril 10mg . Told to find a PCP.    Patient is checking blood pressure at home, range is 130-150s systolic. Reports no side effects. Denies lightheadedness, dizziness, chronic headache, double vision, chest pain, shortness of breath, heart racing, palpitations, nausea, vomiting, abdominal pain, hematuria, and lower leg swelling. Former smoker. Quit in 2017.  Denies alcohol use. Diet consists of only red meat and vegetables. Will not eat any white meat. Does not add salt to food. Drinks mostly water. FH of HTN in mother and T2DM in father. No FH of hear disease. Does not exercise due to back pain. Denies any other aggravating or relieving factors, no other questions or concerns.  Erling ConteFaisal has a current medication list which includes the following prescription(s): lisinopril and loratadine. Also has No Known Allergies.  Erling ConteFaisal  has a past medical history of Back pain. Also  has no past surgical history on file.   Objective:   Vitals: BP 132/84 (BP Location: Left Arm, Patient Position: Sitting, Cuff Size: Normal)   Pulse 90   Temp 98.6 F (37 C) (Oral)   Resp 18   Ht 5' 10.32" (1.786 m)   Wt 162 lb 6.4 oz (73.7 kg)   SpO2 98%   BMI 23.09 kg/m   Physical Exam  Constitutional: He is oriented to person, place, and time. He appears well-developed and well-nourished. No distress.  HENT:  Head: Normocephalic and atraumatic.  Eyes: Conjunctivae are normal. Pupils are equal, round, and reactive to light.  Neck: Normal range of motion.  Cardiovascular: Normal rate, regular rhythm, normal heart sounds and intact distal pulses.  Pulmonary/Chest: Effort normal and breath sounds  normal. He has no wheezes. He has no rales.  Musculoskeletal:       Right lower leg: He exhibits no swelling.       Left lower leg: He exhibits no swelling.  Neurological: He is alert and oriented to person, place, and time.  Skin: Skin is warm and dry.  Psychiatric: He has a normal mood and affect.  Vitals reviewed.   Results for orders placed or performed in visit on 07/19/17 (from the past 24 hour(s))  POCT urinalysis dipstick     Status: Abnormal   Collection Time: 07/19/17  5:38 PM  Result Value Ref Range   Color, UA yellow yellow   Clarity, UA clear clear   Glucose, UA negative negative mg/dL   Bilirubin, UA negative negative   Ketones, POC UA negative negative mg/dL   Spec Grav, UA [phone] - 1.025   Blood, UA trace-intact (A) negative   pH, UA 7.5 5.0 - 8.0   Protein Ur, POC negative negative mg/dL   Urobilinogen, UA 0.2 0.2 or 1.0 E.U./dL   Nitrite, UA Negative Negative   Leukocytes, UA Negative Negative    Assessment and Plan :  1. Essential hypertension Controlled in office today.  Patient is asymptomatic.  Recommend he continue with lisinopril 10 mg daily.  Recommend he continue checking his blood pressure outside the office a few times a week.  Document these values.  Goal is less than 140/90.  Will check fasting labs tomorrow.  In  the meantime, I have placed a referral for cardiology due to abnormal EKG. Follow up with me in office in 2 weeks for reevaluation.  - Lipid panel; Future - TSH; Future - POCT urinalysis dipstick - Ambulatory referral to Cardiology  Benjiman CoreBrittany Wiseman, PA-C  [facility] 07/19/2017 5:49 PM

## 2017-07-19 NOTE — Patient Instructions (Addendum)
Your blood pressure was well controlled in office today.  I recommend he continue with lisinopril 10 mg daily.  Continue checking your blood pressure outside the office.  Your goal is less than 140/90.  Ideally we would like to be closer to 130/80. If you start to have chest pain, blurred vision, shortness of breath, severe headache, lower leg swelling, or nausea/vomiting please seek care immediately here or at the ED.   In terms of your abnormal EKG, I have put in a referral for cardiology.  They should contact you within the next 1-2 weeks.  I have also asked that they would send you a letter.  Please follow-up with me in office in 2 weeks for reevaluation.  Return tomorrow for fasting labs.  To do this to make sure you have not eaten for 8 hours and come to our office and see you here for lab only visit.  Thank you for letting me participate in your health and well being.  How to Take Your Blood Pressure You can take your blood pressure at home with a machine. You may need to check your blood pressure at home:  To check if you have high blood pressure (hypertension).  To check your blood pressure over time.  To make sure your blood pressure medicine is working.  Supplies needed: You will need a blood pressure machine, or monitor. You can buy one at a drugstore or online. When choosing one:  Choose one with an arm cuff.  Choose one that wraps around your upper arm. Only one finger should fit between your arm and the cuff.  Do not choose one that measures your blood pressure from your wrist or finger.  Your doctor can suggest a monitor. How to prepare Avoid these things for 30 minutes before checking your blood pressure:  Drinking caffeine.  Drinking alcohol.  Eating.  Smoking.  Exercising.  Five minutes before checking your blood pressure:  Pee.  Sit in a dining chair. Avoid sitting in a soft couch or armchair.  Be quiet. Do not talk.  How to take your blood  pressure Follow the instructions that came with your machine. If you have a digital blood pressure monitor, these may be the instructions: 1. Sit up straight. 2. Place your feet on the floor. Do not cross your ankles or legs. 3. Rest your left arm at the level of your heart. You may rest it on a table, desk, or chair. 4. Pull up your shirt sleeve. 5. Wrap the blood pressure cuff around the upper part of your left arm. The cuff should be 1 inch (2.5 cm) above your elbow. It is best to wrap the cuff around bare skin. 6. Fit the cuff snugly around your arm. You should be able to place only one finger between the cuff and your arm. 7. Put the cord inside the groove of your elbow. 8. Press the power button. 9. Sit quietly while the cuff fills with air and loses air. 10. Write down the numbers on the screen. 11. Wait 2-3 minutes and then repeat steps 1-10.  What do the numbers mean? Two numbers make up your blood pressure. The first number is called systolic pressure. The second is called diastolic pressure. An example of a blood pressure reading is "120 over 80" (or 120/80). If you are an adult and do not have a medical condition, use this guide to find out if your blood pressure is normal: Normal  First number: below 120.  Second number: below 80. Elevated  First number: 120-129.  Second number: below 80. Hypertension stage 1  First number: 130-139.  Second number: 80-89. Hypertension stage 2  First number: 140 or above.  Second number: 90 or above. Your blood pressure is above normal even if only the top or bottom number is above normal. Follow these instructions at home:  Check your blood pressure as often as your doctor tells you to.  Take your monitor to your next doctor's appointment. Your doctor will: ? Make sure you are using it correctly. ? Make sure it is working right.  Make sure you understand what your blood pressure numbers should be.  Tell your doctor if your  medicines are causing side effects. Contact a doctor if:  Your blood pressure keeps being high. Get help right away if:  Your first blood pressure number is higher than 180.  Your second blood pressure number is higher than 120. This information is not intended to replace advice given to you by your health care provider. Make sure you discuss any questions you have with your health care provider. Document Released: 06/29/2008 Document Revised: 06/14/2016 Document Reviewed: 12/24/2015 Elsevier Interactive Patient Education  2018 [employer]  Hypertension Hypertension is another name for high blood pressure. High blood pressure forces your heart to work harder to pump blood. This can cause problems over time. There are two numbers in a blood pressure reading. There is a top number (systolic) over a bottom number (diastolic). It is best to have a blood pressure below 120/80. Healthy choices can help lower your blood pressure. You may need medicine to help lower your blood pressure if:  Your blood pressure cannot be lowered with healthy choices.  Your blood pressure is higher than 130/80.  Follow these instructions at home: Eating and drinking  If directed, follow the DASH eating plan. This diet includes: ? Filling half of your plate at each meal with fruits and vegetables. ? Filling one quarter of your plate at each meal with whole grains. Whole grains include whole wheat pasta, brown rice, and whole grain bread. ? Eating or drinking low-fat dairy products, such as skim milk or low-fat yogurt. ? Filling one quarter of your plate at each meal with low-fat (lean) proteins. Low-fat proteins include fish, skinless chicken, eggs, beans, and tofu. ? Avoiding fatty meat, cured and processed meat, or chicken with skin. ? Avoiding premade or processed food.  Eat less than 1,500 mg of salt (sodium) a day.  Limit alcohol use to no more than 1 drink a day for nonpregnant women and 2 drinks a  day for men. One drink equals 12 oz of beer, 5 oz of wine, or 1 oz of hard liquor. Lifestyle  Work with your doctor to stay at a healthy weight or to lose weight. Ask your doctor what the best weight is for you.  Get at least 30 minutes of exercise that causes your heart to beat faster (aerobic exercise) most days of the week. This may include walking, swimming, or biking.  Get at least 30 minutes of exercise that strengthens your muscles (resistance exercise) at least 3 days a week. This may include lifting weights or pilates.  Do not use any products that contain nicotine or tobacco. This includes cigarettes and e-cigarettes. If you need help quitting, ask your doctor.  Check your blood pressure at home as told by your doctor.  Keep all follow-up visits as told by your doctor. This is important. Medicines  Take over-the-counter and prescription medicines only as told by your doctor. Follow directions carefully.  Do not skip doses of blood pressure medicine. The medicine does not work as well if you skip doses. Skipping doses also puts you at risk for problems.  Ask your doctor about side effects or reactions to medicines that you should watch for. Contact a doctor if:  You think you are having a reaction to the medicine you are taking.  You have headaches that keep coming back (recurring).  You feel dizzy.  You have swelling in your ankles.  You have trouble with your vision. Get help right away if:  You get a very bad headache.  You start to feel confused.  You feel weak or numb.  You feel faint.  You get very bad pain in your: ? Chest. ? Belly (abdomen).  You throw up (vomit) more than once.  You have trouble breathing. Summary  Hypertension is another name for high blood pressure.  Making healthy choices can help lower blood pressure. If your blood pressure cannot be controlled with healthy choices, you may need to take medicine. This information is not  intended to replace advice given to you by your health care provider. Make sure you discuss any questions you have with your health care provider. Document Released: 01/03/2008 Document Revised: 06/14/2016 Document Reviewed: 06/14/2016 Elsevier Interactive Patient Education  2018 [employer]    IF you received an x-ray today, you will receive an invoice from [facility]. Please contact [facility] at [phone] with questions or concerns regarding your invoice.   IF you received labwork today, you will receive an invoice from [facility]. Please contact [facility] at [phone] with questions or concerns regarding your invoice.   Our billing staff will not be able to assist you with questions regarding bills from these companies.  You will be contacted with the lab results as soon as they are available. The fastest way to get your results is to activate your My Chart account. Instructions are located on the last page of this paperwork. If you have not heard from us regarding the results in 2 weeks, please contact this office.

## 2017-07-20 ENCOUNTER — Ambulatory Visit: Payer: BLUE CROSS/BLUE SHIELD | Admitting: Physician Assistant

## 2017-07-20 DIAGNOSIS — R7989 Other specified abnormal findings of blood chemistry: Secondary | ICD-10-CM

## 2017-07-20 DIAGNOSIS — I10 Essential (primary) hypertension: Secondary | ICD-10-CM

## 2017-07-20 LAB — URINALYSIS, MICROSCOPIC ONLY
Casts: NONE SEEN /lpf
Epithelial Cells (non renal): NONE SEEN /hpf (ref 0–10)
RBC MICROSCOPIC, UA: NONE SEEN /HPF (ref 0–?)

## 2017-07-20 NOTE — Progress Notes (Signed)
Lab only visit 

## 2017-07-21 LAB — LIPID PANEL
CHOLESTEROL TOTAL: 370 mg/dL — AB (ref 100–199)
Chol/HDL Ratio: 8.8 ratio — ABNORMAL HIGH (ref 0.0–5.0)
HDL: 42 mg/dL (ref >39)
LDL CALC: 280 mg/dL — AB (ref 0–99)
TRIGLYCERIDES: 240 mg/dL — AB (ref 0–149)
VLDL Cholesterol Cal: 48 mg/dL — ABNORMAL HIGH (ref 5–40)

## 2017-07-21 LAB — TSH: TSH: 230.7 u[IU]/mL — ABNORMAL HIGH (ref 0.450–4.500)

## 2017-07-26 ENCOUNTER — Other Ambulatory Visit: Payer: Self-pay | Admitting: Physician Assistant

## 2017-07-26 DIAGNOSIS — R7989 Other specified abnormal findings of blood chemistry: Secondary | ICD-10-CM

## 2017-07-26 NOTE — Addendum Note (Signed)
Addended by: Baldwin CrownJOHNSON, SHAQUETTA D on: 07/26/2017 05:39 PM   Modules accepted: Orders

## 2017-07-26 NOTE — Progress Notes (Unsigned)
Will you please call pt and let him know that his TSH was very elevated. This indicates that his thyroid may not be working so well. We are going to try and add on some labs if it is not too late to do so. I also recommend he see endocrinology due to the large elevation in his TSH for further evaluation and management. They should contact him within the next week to schedule appointment.  His cholesterol is also very elevated.  This could be due to his thyroid levels being off.  We will wait to treat these levels until he is further evaluated by endocrinology for his elevated TSH.  Please me know if he has any questions or concerns.  If he does not hear from endocrinology within 1 week, please contact our office to let us know this.  Thank you.

## 2017-07-27 ENCOUNTER — Telehealth: Payer: Self-pay | Admitting: General Practice

## 2017-07-27 LAB — T3, FREE: T3 FREE: 0.8 pg/mL — AB (ref 2.0–4.4)

## 2017-07-27 LAB — T4, FREE: FREE T4: 0.16 ng/dL — AB (ref 0.82–1.77)

## 2017-07-27 NOTE — Progress Notes (Signed)
lmvm to call us back  

## 2017-07-27 NOTE — Telephone Encounter (Signed)
Patient was notified of results- he is concerned that he will miss a call because he works 5a- 5p. Messages may be left on his voice mail. He will follow up if he does not hear within a week.

## 2017-07-27 NOTE — Telephone Encounter (Signed)
Copied from CRM [phone]. Topic: Quick Communication - Office Called Patient >> Jul 27, 2017  3:03 PM Cleophus MoltGoodwin, Tasha K, CMA wrote: Reason for CRM: lmvm for pt to call us back. Okay for Nurse Triage to give MD message as follows ;Will you please call pt and let him know that his TSH was very elevated. This indicates that his thyroid may not be working so well. We are going to try and add on some labs if it is not too late to do so. I also recommend he see endocrinology due to the large elevation in his TSH for further evaluation and management. They should contact him within the next week to schedule appointment.  His cholesterol is also very elevated.  This could be due to his thyroid levels being off.  We will wait to treat these levels until he is further evaluated by endocrinology for his elevated TSH.  Please me know if he has any questions or concerns.  If he does not hear from endocrinology within 1 week, please contact our office to let us know this.  Thank you.

## 2017-08-02 ENCOUNTER — Ambulatory Visit (INDEPENDENT_AMBULATORY_CARE_PROVIDER_SITE_OTHER): Payer: BLUE CROSS/BLUE SHIELD | Admitting: Physician Assistant

## 2017-08-02 ENCOUNTER — Encounter: Payer: Self-pay | Admitting: Physician Assistant

## 2017-08-02 ENCOUNTER — Other Ambulatory Visit: Payer: Self-pay

## 2017-08-02 VITALS — BP 132/86 | HR 85 | Temp 97.6°F | Resp 18 | Ht 71.46 in | Wt 161.6 lb

## 2017-08-02 DIAGNOSIS — I10 Essential (primary) hypertension: Secondary | ICD-10-CM | POA: Diagnosis not present

## 2017-08-02 DIAGNOSIS — E785 Hyperlipidemia, unspecified: Secondary | ICD-10-CM

## 2017-08-02 DIAGNOSIS — J309 Allergic rhinitis, unspecified: Secondary | ICD-10-CM | POA: Diagnosis not present

## 2017-08-02 DIAGNOSIS — R7989 Other specified abnormal findings of blood chemistry: Secondary | ICD-10-CM | POA: Diagnosis not present

## 2017-08-02 MED ORDER — CETIRIZINE HCL 10 MG PO TABS: 10.0000 mg | ORAL_TABLET | Freq: Every day | ORAL | 11 refills | Status: AC

## 2017-08-02 MED ORDER — FLUTICASONE PROPIONATE 50 MCG/ACT NA SUSP: 2.0000 | Freq: Every day | NASAL | 6 refills | Status: AC

## 2017-08-02 NOTE — Patient Instructions (Addendum)
For allergies, I have sent in prescription for Zyrtec and Flonase.  Please take daily as prescribed.   For high blood pressure, continue monitoring her blood pressure at home.  Goal is less than 140/90.  You should have enough medication that will you until you see cardiology.  Please follow-up with cardiology as planned.  For elevated TSH, follow up with endocrinology: your referral was placed to:  [facility] : ask for Endocrinology dept to schedule an appointment  [place]  Address: [address], [place], [place] [address]  Hours:  Closed ? Opens 7:30AM Fri      Phone: [phone]   IF you received an x-ray today, you will receive an invoice from [facility]. Please contact [facility] at [phone] with questions or concerns regarding your invoice.   IF you received labwork today, you will receive an invoice from [facility]. Please contact [facility] at [phone] with questions or concerns regarding your invoice.   Our billing staff will not be able to assist you with questions regarding bills from these companies.  You will be contacted with the lab results as soon as they are available. The fastest way to get your results is to activate your My Chart account. Instructions are located on the last page of this paperwork. If you have not heard from us regarding the results in 2 weeks, please contact this office.

## 2017-08-02 NOTE — Progress Notes (Signed)
MRN: [id number] DOB: 01/21/1976  Subjective:   Allen Ramos is a 42 y.o. male presenting for follow up on Hypertension and to discuss lab results.   In terms of hypertension, he was diagnosed in the ED on 07/16/17.  He is currently controlled with lisinopril 10 mg tablet daily. Patient is checking blood pressure at home, range is 130s systolic.  His EKG in the ED showed sinus rhythm with incomplete right bundle branch block and T wave inversion in inferior lateral leads.  Cardiology referral was placed at last appointment on 07/19/17.  Notes he has an appointment with them on 08/09/16.  Reports no side effects or symptoms. Denies lightheadedness, dizziness, chronic headache, double vision, chest pain, shortness of breath, heart racing, palpitations, nausea, vomiting, abdominal pain, hematuria, lower leg swelling.  Former smoker.  Quit in 2017.  Denies alcohol use.  In terms of labs, we collected TSH and lipid panel at last visit.  TSH was significantly elevated at 230.  Free T3 and free T4 were low.  Cholesterol was elevated at 370 and triglycerides elevated at 240.  In terms of abnormal thyroid test, patient reports that he has never had his thyroid checked.  He does admit to excessive fatigue over the past few years.  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  He is from [place] and went and had this issue evaluated there.  This showed that 35% of his semen was active, 15% was inactive, and 50% was dead.  He was instructed to find an infertility doctor whenever he got to the [place] but has not done this yet.  In terms of his lipid panel, he notes that he was told in the past that he does have high cholesterol and was recommended to start on a statin.  However, he reports that multiple family members have had negative responses to statins so he has not taken the medication and has tried to change his diet by limiting the amount of fats he consumes.  He denies chest pain,  heart palpitations, dry skin, diarrhea, abdominal pain, and muscle weakness.  Referral to endocrinology has been placed.  According to his missed calls, they contacted him a couple days ago but did not leave a voicemail so he did not know that that was their office.  Review of Systems  Constitutional: Negative for chills and fever.  HENT: Positive for congestion.        Positive for sneezing  Respiratory: Negative for cough and shortness of breath.   Endo/Heme/Allergies: Positive for environmental allergies (has hx of seasonal allergies, not currently taking any medication).   Erling ConteFaisal has a current medication list which includes the following prescription(s): lisinopril and loratadine. Also has No Known Allergies.  Erling ConteFaisal  has a past medical history of Back pain. Also  has no past surgical history on file.   Objective:   Vitals: BP 132/86 (BP Location: Left Arm, Patient Position: Sitting, Cuff Size: Normal)   Pulse 85   Temp 97.6 F (36.4 C) (Oral)   Resp 18   Ht 5' 11.46" (1.815 m)   Wt 161 lb 9.6 oz (73.3 kg)   SpO2 99%   BMI 22.25 kg/m     Physical Exam  Constitutional: He is oriented to person, place, and time. He appears well-developed and well-nourished.  HENT:  Head: Normocephalic and atraumatic.  Right Ear: Tympanic membrane, external ear and ear canal normal.  Left Ear: Tympanic membrane, external ear and  ear canal normal.  Nose: Mucosal edema (moderate bilaterally) present.  Mouth/Throat: Uvula is midline, oropharynx is clear and moist and mucous membranes are normal.  Eyes: Conjunctivae are normal.  Neck: Normal range of motion.  Cardiovascular: Normal rate, regular rhythm, normal heart sounds and intact distal pulses.  Pulmonary/Chest: Effort normal and breath sounds normal. He has no wheezes. He has no rales.  Musculoskeletal:       Right lower leg: He exhibits no swelling.       Left lower leg: He exhibits no swelling.  Neurological: He is alert and oriented to  person, place, and time.  Skin: Skin is warm and dry.  Psychiatric: He has a normal mood and affect.  Vitals reviewed.   No results found for this or any previous visit (from the past 24 hour(s)).  Assessment and Plan :  1. Essential hypertension Controlled in office.  Continue with current medication regimen.  Encouraged to continue lifestyle modifications.  Follow-up with cardiology as planned.  2. Elevated TSH Given telephone number for Endocrinology office his referral was sent to.  Educated to contact them tomorrow to schedule an appointment.  3. Dyslipidemia Suspect elevated TSH is contributing to dyslipidemia.  Recommended patient follow-up with endocrinology for further evaluation and treatment of hypothyroidism.  He does not want to take a statin due to family history of side effects to statins.  Encouraged patient to discuss this with both endocrinology and cardiology.  Patient understands and agrees to this event.  4. Allergic rhinitis, unspecified seasonality, unspecified trigger - fluticasone (FLONASE) 50 MCG/ACT nasal spray; Place 2 sprays into both nostrils daily.  Dispense: 16 g; Refill: 6 - cetirizine (ZYRTEC) 10 MG tablet; Take 1 tablet (10 mg total) by mouth daily.  Dispense: 30 tablet; Refill: 11   Benjiman Core, PA-C  Primary Care at [facility] 08/03/2017 1:07 PM

## 2017-08-09 ENCOUNTER — Encounter: Payer: Self-pay | Admitting: Cardiology

## 2017-08-09 ENCOUNTER — Other Ambulatory Visit: Payer: Self-pay | Admitting: Cardiology

## 2017-08-09 DIAGNOSIS — R9431 Abnormal electrocardiogram [ECG] [EKG]: Secondary | ICD-10-CM

## 2017-08-09 DIAGNOSIS — I259 Chronic ischemic heart disease, unspecified: Secondary | ICD-10-CM

## 2017-08-09 DIAGNOSIS — E785 Hyperlipidemia, unspecified: Secondary | ICD-10-CM | POA: Insufficient documentation

## 2017-08-09 DIAGNOSIS — E039 Hypothyroidism, unspecified: Secondary | ICD-10-CM | POA: Insufficient documentation

## 2017-08-09 DIAGNOSIS — E782 Mixed hyperlipidemia: Secondary | ICD-10-CM

## 2017-08-09 DIAGNOSIS — I10 Essential (primary) hypertension: Secondary | ICD-10-CM | POA: Insufficient documentation

## 2017-08-09 DIAGNOSIS — R079 Chest pain, unspecified: Secondary | ICD-10-CM | POA: Insufficient documentation

## 2017-08-09 NOTE — Progress Notes (Unsigned)
Allen Ramos    Date of visit:  08/09/2017 DOB:  1975-12-07    Age:  42 yrs. Medical record number:  [id number]     Account number:  [id number] Primary Care Provider: Benjiman Core DAWN ____________________________ CURRENT DIAGNOSES  1. Chest pain  2. Abnormal electrocardiogram [ECG]  3. Essential (primary) Hypertension  4. Hyperlipidemia  5. Hypothyroidism ____________________________ ALLERGIES  No Known Drug Allergies ____________________________ MEDICATIONS  1. cetirizine 10 mg tablet, 1 p.o. daily  2. lisinopril 10 mg tablet, 1 p.o. daily  3. mometasone 50 mcg/actuation nasal spray, Take as directed ____________________________ HISTORY OF PRESENT ILLNESS This 42 year old male is seen for evaluation of an abnormal EKG, chest pain and hypertension. He has a negative [place] and recently was in the emergency room with some chest discomfort as well as uncontrolled hypertension. He was later seen at urgent medical care and was noted to have a abnormal EKG with inferolateral T-wave inversions. He was placed on lisinopril with improvement in his blood pressure. He was found to be profoundly hypothyroid with a TSH of 230 and has been given an appointment to see endocrinology but has not yet been started on thyroid replacement. 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 He has a history of hyperlipidemia and his family and has profound hyperlipidemia but also is profoundly hypothyroid. He has not had chest pain since he was in the emergency room and his blood pressure is under better control. He denies PND, orthopnea or edema. He complains of headaches on an intermittent basis and also has a history of low back pain. ____________________________ PAST HISTORY  Past Medical Illnesses:  hypertension;  Cardiovascular Illnesses:  no previous history of cardiac disease;  Infectious Diseases:  no previous history of significant infectious diseases;  Surgical Procedures:  no  previous surgical procedures;  Trauma History:  no previous history of significant trauma;  NYHA Classification:  I;  Cardiology Procedures-Invasive:  no previous interventional or invasive cardiology procedures;  Cardiology Procedures-Noninvasive:  no previous non-invasive cardiovascular testing;  Peripheral Vascular Procedures:  no previous invasive peripheral vascular procedures.;  LVEF not documented,   ____________________________ CARDIO-PULMONARY TEST DATES EKG Date:  08/09/2017;   ____________________________ FAMILY HISTORY Brother -- Alive and well, Diabetes mellitus Father -- Father alive with problem, Hypertension Mother -- Mother alive with problem, Diabetes mellitus Sister -- Sister alive with problem, Hypertension ____________________________ SOCIAL HISTORY Alcohol Use:  does not use alcohol;  Smoking:  used to smoke but quit;  Diet:  regular diet;  Lifestyle:  married;  Exercise:  no regular exercise;  Occupation:  Radio broadcast assistant;  Residence:  lives with wife and children;   ____________________________ REVIEW OF SYSTEMS General:  denies recent weight change, fatique or change in exercise tolerance.  Integumentary:no rashes or new skin lesions. Eyes: wears eye glasses/contact lenses, denies diplopia, glaucoma or visual field defects. Ears, Nose, Throat, Mouth:  denies any hearing loss, epistaxis, hoarseness or difficulty speaking. Respiratory: denies dyspnea, cough, wheezing or hemoptysis. Cardiovascular:  please review HPI Abdominal: denies dyspepsia, GI bleeding, constipation, or diarrhea Genitourinary-Male: no dysuria, urgency, frequency, or nocturia  Musculoskeletal:  chronic low back pain Neurological:  headaches Psychiatric:  denies depression or anxiety Hematological/Immunologic:  denies any food allergies, bleeding disorders. ____________________________ PHYSICAL EXAMINATION VITAL SIGNS  Blood Pressure:  116/84 Sitting, Left arm, regular cuff  , 120/86 Standing, Left  arm and regular cuff   Pulse:  84/min. Weight:  167.00 lbs. Height:  70"BMI: 24  Constitutional:  pleasant African  Americian male in no acute distress Skin:  warm and dry to touch, no apparent skin lesions, or masses noted. Head:  normocephalic, normal hair pattern, no masses or tenderness Eyes:  EOMS Intact, PERRLA, C and S clear, Funduscopic exam not done. ENT:  ears, nose and throat reveal no gross abnormalities.  Dentition good. Neck:  supple, without massess. No JVD, thyromegaly or carotid bruits. Carotid upstroke normal. Chest:  normal symmetry, clear to auscultation. Cardiac:  regular rhythm, normal S1 and S2, No S3 or S4, no murmurs, gallops or rubs detected. Abdomen:  abdomen soft,non-tender, no masses, no hepatospenomegaly, or aneurysm noted Peripheral Pulses:  the femoral,dorsalis pedis, and posterior tibial pulses are full and equal bilaterally with no bruits auscultated. Extremities & Back:  no deformities, clubbing, cyanosis, erythema or edema observed. Normal muscle strength and tone. Neurological:  no gross motor or sensory deficits noted, affect appropriate, oriented x3. ____________________________ MOST RECENT LIPID PANEL 07/20/17  CHOL TOTL 370 mg/dl, LDL 235280 NM, HDL 42 mg/dl, TRIGLYCER 573240 mg/dl, CHOL/HDL 8.8 (Calc) and VLDL 48 ____________________________ IMPRESSIONS/PLAN  1. Abnormal EKG with inferolateral T-wave inversions with multiple cardiovascular risk factors 2. Chest pain with abnormal EKG 3. Hypertension controlled 4. Profound hypothyroidism  Recommendations:  In light of his severe hyperlipidemia chest pain and abnormal EKG suggestive of ischemia recommended that we go directly to a cardiac CTA. He is to see the endocrinologist and he will need replacement of his hypothyroid status he has severe myxedema. I would also like him to have an echocardiogram to assess LV wall thickness. May consider this after he has the cardiac  CTA. ____________________________ TODAYS ORDERS  1. 12 Lead EKG: Today  2. Cardiac CTA: First Available  3. Return After Diagnostic Testing: Y                       ____________________________ Cardiology Physician:  Darden PalmerW. Spencer Lashona Schaaf, Jr. MD [facility]

## 2017-09-03 ENCOUNTER — Ambulatory Visit (HOSPITAL_COMMUNITY)
Admission: RE | Admit: 2017-09-03 | Discharge: 2017-09-03 | Disposition: A | Discharge status: Home / Self Care | Payer: BLUE CROSS/BLUE SHIELD | Source: Ambulatory Visit | Attending: Cardiology | Admitting: Cardiology

## 2017-09-03 DIAGNOSIS — R9431 Abnormal electrocardiogram [ECG] [EKG]: Secondary | ICD-10-CM | POA: Diagnosis present

## 2017-09-03 DIAGNOSIS — R079 Chest pain, unspecified: Secondary | ICD-10-CM | POA: Diagnosis not present

## 2017-09-03 DIAGNOSIS — I259 Chronic ischemic heart disease, unspecified: Secondary | ICD-10-CM | POA: Diagnosis present

## 2017-09-03 MED ORDER — METOPROLOL TARTRATE 5 MG/5ML IV SOLN
INTRAVENOUS | Status: AC
  Filled 2017-09-03: qty 15

## 2017-09-03 MED ORDER — NITROGLYCERIN 0.4 MG SL SUBL
0.8000 mg | SUBLINGUAL_TABLET | Freq: Once | SUBLINGUAL | Status: AC
  Administered 2017-09-03: 0.8 mg via SUBLINGUAL

## 2017-09-03 MED ORDER — METOPROLOL TARTRATE 5 MG/5ML IV SOLN
5.0000 mg | INTRAVENOUS | Status: DC | PRN
  Administered 2017-09-03 (×3): 5 mg via INTRAVENOUS

## 2017-09-03 MED ORDER — NITROGLYCERIN 0.4 MG SL SUBL
SUBLINGUAL_TABLET | SUBLINGUAL | Status: AC
  Filled 2017-09-03: qty 2

## 2017-09-03 MED ORDER — IOPAMIDOL (ISOVUE-370) INJECTION 76%
INTRAVENOUS | Status: AC
  Filled 2017-09-03: qty 100

## 2017-09-19 ENCOUNTER — Telehealth: Payer: Self-pay | Admitting: Physician Assistant

## 2017-09-19 ENCOUNTER — Other Ambulatory Visit: Payer: Self-pay | Admitting: Physician Assistant

## 2017-09-19 NOTE — Telephone Encounter (Signed)
Lisinopril refill Last OV: 08-02-17 with BeninBrittany Wiseman,PA Last Refill:07/16/17 #30 with 1 refill by Dr. Linwood DibblesJon Knapp [facility] [address]

## 2017-09-19 NOTE — Telephone Encounter (Signed)
Copied from CRM [phone]. Topic: Quick Communication - Rx Refill/Question >> Sep 19, 2017 11:55 AM Arlyss Gandyichardson, Parry Po N, NT wrote: Medication: lisinopril (PRINIVIL,ZESTRIL)    Has the patient contacted their pharmacy? Yes.     (Agent: If no, request that the patient contact the pharmacy for the refill.)   Preferred Pharmacy (with phone number or street name): [facility] [address]   Agent: Please be advised that RX refills may take up to 3 business days. We ask that you follow-up with your pharmacy.

## 2017-11-02 ENCOUNTER — Other Ambulatory Visit: Payer: Self-pay | Admitting: Physician Assistant

## 2017-11-02 ENCOUNTER — Telehealth: Payer: Self-pay

## 2017-11-02 DIAGNOSIS — E785 Hyperlipidemia, unspecified: Secondary | ICD-10-CM

## 2017-11-02 NOTE — Telephone Encounter (Signed)
Copied from CRM [phone]. Topic: Inquiry >> Nov 02, 2017  9:35 AM Diana EvesHoyt, Maryann B wrote: Reason for CRM: pt would like lab order for cholesterol. Pt is hoping he can come in tomorrow and get that lab done tomorrow as he is off work. He is wanting to get this lab done then schedule a follow up with Barnett AbuWiseman.

## 2017-11-02 NOTE — Progress Notes (Signed)
Orders Placed This Encounter  Procedures  . Lipid panel    Standing Status:   Future    Standing Expiration Date:   11/03/2018    Order Specific Question:   Has the patient fasted?    Answer:   No

## 2017-11-14 ENCOUNTER — Ambulatory Visit (INDEPENDENT_AMBULATORY_CARE_PROVIDER_SITE_OTHER): Payer: BLUE CROSS/BLUE SHIELD | Admitting: Physician Assistant

## 2017-11-14 DIAGNOSIS — E785 Hyperlipidemia, unspecified: Secondary | ICD-10-CM

## 2017-11-14 LAB — LIPID PANEL
CHOL/HDL RATIO: 8.5 ratio — AB (ref 0.0–5.0)
CHOLESTEROL TOTAL: 221 mg/dL — AB (ref 100–199)
HDL: 26 mg/dL — AB (ref >39)
TRIGLYCERIDES: 511 mg/dL — AB (ref 0–149)

## 2017-11-14 NOTE — Progress Notes (Signed)
Lab Only Visit 

## 2017-11-15 ENCOUNTER — Other Ambulatory Visit: Payer: Self-pay | Admitting: Physician Assistant

## 2017-11-22 ENCOUNTER — Ambulatory Visit (INDEPENDENT_AMBULATORY_CARE_PROVIDER_SITE_OTHER): Payer: BLUE CROSS/BLUE SHIELD | Admitting: Physician Assistant

## 2017-11-22 ENCOUNTER — Other Ambulatory Visit: Payer: Self-pay

## 2017-11-22 ENCOUNTER — Encounter: Payer: Self-pay | Admitting: Physician Assistant

## 2017-11-22 VITALS — BP 118/72 | HR 89 | Temp 98.7°F | Resp 18 | Ht 70.55 in | Wt 151.4 lb

## 2017-11-22 DIAGNOSIS — B359 Dermatophytosis, unspecified: Secondary | ICD-10-CM

## 2017-11-22 DIAGNOSIS — R21 Rash and other nonspecific skin eruption: Secondary | ICD-10-CM

## 2017-11-22 DIAGNOSIS — E785 Hyperlipidemia, unspecified: Secondary | ICD-10-CM | POA: Diagnosis not present

## 2017-11-22 DIAGNOSIS — E039 Hypothyroidism, unspecified: Secondary | ICD-10-CM

## 2017-11-22 MED ORDER — CLOTRIMAZOLE 1 % EX CREA: 1.0000 "application " | TOPICAL_CREAM | Freq: Two times a day (BID) | CUTANEOUS | 0 refills | Status: AC

## 2017-11-22 NOTE — Progress Notes (Signed)
   Allen Ramos Witz  MRN: [id number] DOB: 07/31/1975  Subjective:  Allen Ramos Weider is a 42 y.o. male seen in office today for a chief complaint of follow-up on lipid panel.  Patient has history of hypothyroidism and is following with endocrinology.  Currently being treated with levothyroxine 125 mcg daily.  Notes that his last endocrinology visit 1 month ago his TSH was normal.  They told him to follow-up with his primary care doctor to have cholesterol repeated to see if he needs to be on medication for cholesterol.  His lipid panel was obtained last week and showed elevated total cholesterol and triglycerides.  He does not remember what his TSH was on his last endocrinology visit.Eats only red meat, rice, and some veggies. Drinks only water.   He is also having a rash on his right lower abdomen.  It has been there for a few weeks.  Denies pain.  Has associated itching.  No other associated symptoms.  No new exposures to laundry, soap, lotion, animals, plants, or medications.  Has not tried anything for relief.   Review of Systems  Per HPI  Patient Active Problem List   Diagnosis Date Noted  . Abnormal EKG 08/09/2017  . Hypertension 08/09/2017  . 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 Chest pain 08/09/2017  . Hyperlipidemia 08/09/2017  . ANEMIA, VITAMIN B12 DEFICIENCY 09/01/2009  . 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 cetirizine (ZYRTEC) 10 MG tablet Take 1 tablet (10 mg total) by mouth daily. 30 tablet 11  . fluticasone (FLONASE) 50 MCG/ACT nasal spray Place 2 sprays into both nostrils daily. 16 g 6  . lisinopril (PRINIVIL,ZESTRIL) 10 MG tablet TAKE 1 TABLET BY MOUTH EVERY DAY 30 tablet 0   No current facility-administered medications on file prior to visit.     No Known Allergies   Objective:  BP 118/72 (BP Location: Left Arm, Patient Position: Sitting, Cuff Size: Normal)   Pulse 89   Temp 98.7 F (37.1 C) (Oral)    Resp 18   Ht 5' 10.55" (1.792 m)   Wt 151 lb 6.4 oz (68.7 kg)   SpO2 97%   BMI 21.39 kg/m   Physical Exam  Constitutional: He is oriented to person, place, and time. He appears well-developed and well-nourished.  HENT:  Head: Normocephalic and atraumatic.  Eyes: Conjunctivae are normal.  Neck: Normal range of motion.  Pulmonary/Chest: Effort normal.  Neurological: He is alert and oriented to person, place, and time.  Skin: Skin is warm and dry. Rash (~2cm oval scaly plaque  with central clearing and raised borders noted on right lower anterior abdomen) noted.  Psychiatric: He has a normal mood and affect.  Vitals reviewed.   Assessment and Plan :  1. Hypothyroidism, unspecified type - TSH 2. Dyslipidemia TSH pending. If WNR, will initiate statin for dyslipidemia at that time. Educated pt on statins. Also discussed dietary changes which can help lower cholesterol. Given him educational material on high cholesterol and diet.  3. Rash and nonspecific skin eruption 4. Tinea Rash consistent with tinea. Will treat empirically at this time. Advised to return to clinic if symptoms worsen, do not improve, or as needed. - clotrimazole (LOTRIMIN) 1 % cream; Apply 1 application topically 2 (two) times daily.  Dispense: 30 g; Refill: 0   Benjiman CoreBrittany Maleya Leever PA-C  [facility]  [facility] 11/22/2017 3:40 PM

## 2017-11-22 NOTE — Patient Instructions (Addendum)
I collected a TSH and should have that result back within 1 week.  I will contact you with these results.  Depending on the results, we will likely start medication to help lower your cholesterol.  Your rash is consistent with a ringworm.  I have given you prescription for A cream to use twice daily.  If no improvement after 10 days, please return office for further evaluation.    High Cholesterol High cholesterol is a condition in which the blood has high levels of a white, waxy, fat-like substance (cholesterol). The human body needs small amounts of cholesterol. The liver makes all the cholesterol that the body needs. Extra (excess) cholesterol comes from the food that we eat. Cholesterol is carried from the liver by the blood through the blood vessels. If you have high cholesterol, deposits (plaques) may build up on the walls of your blood vessels (arteries). Plaques make the arteries narrower and stiffer. Cholesterol plaques increase your risk for heart attack and stroke. Work with your health care provider to keep your cholesterol levels in a healthy range. What increases the risk? This condition is more likely to develop in people who:  Eat foods that are high in animal fat (saturated fat) or cholesterol.  Are overweight.  Are not getting enough exercise.  Have a family history of high cholesterol.  What are the signs or symptoms? There are no symptoms of this condition. How is this diagnosed? This condition may be diagnosed from the results of a blood test.  If you are older than age 60, your health care provider may check your cholesterol every 4-6 years.  You may be checked more often if you already have high cholesterol or other risk factors for heart disease.  The blood test for cholesterol measures:  "Bad" cholesterol (LDL cholesterol). This is the main type of cholesterol that causes heart disease. The desired level for LDL is less than 100.  "Good" cholesterol (HDL  cholesterol). This type helps to protect against heart disease by cleaning the arteries and carrying the LDL away. The desired level for HDL is 60 or higher.  Triglycerides. These are fats that the body can store or burn for energy. The desired number for triglycerides is lower than 150.  Total cholesterol. This is a measure of the total amount of cholesterol in your blood, including LDL cholesterol, HDL cholesterol, and triglycerides. A healthy number is less than 200.  How is this treated? This condition is treated with diet changes, lifestyle changes, and medicines. Diet changes  This may include eating more whole grains, fruits, vegetables, nuts, and fish.  This may also include cutting back on red meat and foods that have a lot of added sugar. Lifestyle changes  Changes may include getting at least 40 minutes of aerobic exercise 3 times a week. Aerobic exercises include walking, biking, and swimming. Aerobic exercise along with a healthy diet can help you maintain a healthy weight.  Changes may also include quitting smoking. Medicines  Medicines are usually given if diet and lifestyle changes have failed to reduce your cholesterol to healthy levels.  Your health care provider may prescribe a statin medicine. Statin medicines have been shown to reduce cholesterol, which can reduce the risk of heart disease. Follow these instructions at home: Eating and drinking  If told by your health care provider:  Eat chicken (without skin), fish, veal, shellfish, ground [place] breast, and round or loin cuts of red meat.  Do not eat fried foods or fatty  meats, such as hot dogs and salami.  Eat plenty of fruits, such as apples.  Eat plenty of vegetables, such as broccoli, potatoes, and carrots.  Eat beans, peas, and lentils.  Eat grains such as barley, rice, couscous, and bulgur wheat.  Eat pasta without cream sauces.  Use skim or nonfat milk, and eat low-fat or nonfat yogurt and  cheeses.  Do not eat or drink whole milk, cream, ice cream, egg yolks, or hard cheeses.  Do not eat stick margarine or tub margarines that contain trans fats (also called partially hydrogenated oils).  Do not eat saturated tropical oils, such as coconut oil and palm oil.  Do not eat cakes, cookies, crackers, or other baked goods that contain trans fats.  General instructions  Exercise as directed by your health care provider. Increase your activity level with activities such as gardening, walking, and taking the stairs.  Take over-the-counter and prescription medicines only as told by your health care provider.  Do not use any products that contain nicotine or tobacco, such as cigarettes and e-cigarettes. If you need help quitting, ask your health care provider.  Keep all follow-up visits as told by your health care provider. This is important. Contact a health care provider if:  You are struggling to maintain a healthy diet or weight.  You need help to start on an exercise program.  You need help to stop smoking. Get help right away if:  You have chest pain.  You have trouble breathing. This information is not intended to replace advice given to you by your health care provider. Make sure you discuss any questions you have with your health care provider. Document Released: 07/17/2005 Document Revised: 02/12/2016 Document Reviewed: 01/15/2016 Elsevier Interactive Patient Education  2018 [employer]  Body Ringworm Body ringworm is an infection of the skin that often causes a ring-shaped rash. Body ringworm can affect any part of your skin. It can spread easily to others. Body ringworm is also called tinea corporis. What are the causes? This condition is caused by funguses called dermatophytes. The condition develops when these funguses grow out of control on the skin. You can get this condition if you touch a person or animal that has it. You can also get it if you share  clothing, bedding, towels, or any other object with an infected person or pet. What increases the risk? This condition is more likely to develop in:  Athletes who often make skin-to-skin contact with other athletes, such as wrestlers.  People who share equipment and mats.  People with a weakened immune system.  What are the signs or symptoms? Symptoms of this condition include:  Itchy, raised red spots and bumps.  Red scaly patches.  A ring-shaped rash. The rash may have: ? A clear center. ? Scales or red bumps at its center. ? Redness near its borders. ? Dry and scaly skin on or around it.  How is this diagnosed? This condition can usually be diagnosed with a skin exam. A skin scraping may be taken from the affected area and examined under a microscope to see if the fungus is present. How is this treated? This condition may be treated with:  An antifungal cream or ointment.  An antifungal shampoo.  Antifungal medicines. These may be prescribed if your ringworm is severe, keeps coming back, or lasts a long time.  Follow these instructions at home:  Take over-the-counter and prescription medicines only as told by your health care provider.  If you  were given an antifungal cream or ointment: ? Use it as told by your health care provider. ? Wash the infected area and dry it completely before applying the cream or ointment.  If you were given an antifungal shampoo: ? Use it as told by your health care provider. ? Leave the shampoo on your body for 3-5 minutes before rinsing.  While you have a rash: ? Wear loose clothing to stop clothes from rubbing and irritating it. ? Wash or change your bed sheets every night.  If your pet has the same infection, take your pet to see a International aid/development worker. How is this prevented?  Practice good hygiene.  Wear sandals or shoes in public places and showers.  Do not share personal items with others.  Avoid touching red patches of skin on  other people.  Avoid touching pets that have bald spots.  If you touch an animal that has a bald spot, wash your hands. Contact a health care provider if:  Your rash continues to spread after 7 days of treatment.  Your rash is not gone in 4 weeks.  The area around your rash gets red, warm, tender, and swollen. This information is not intended to replace advice given to you by your health care provider. Make sure you discuss any questions you have with your health care provider. Document Released: 07/14/2000 Document Revised: 12/23/2015 Document Reviewed: 05/13/2015 Elsevier Interactive Patient Education  2018 [employer]. Atorvastatin tablets What is this medicine? ATORVASTATIN (a TORE va sta tin) is known as a HMG-CoA reductase inhibitor or 'statin'. It lowers the level of cholesterol and triglycerides in the blood. This drug may also reduce the risk of heart attack, stroke, or other health problems in patients with risk factors for heart disease. Diet and lifestyle changes are often used with this drug. This medicine may be used for other purposes; ask your health care provider or pharmacist if you have questions. COMMON BRAND NAME(S): Lipitor What should I tell my health care provider before I take this medicine? They need to know if you have any of these conditions: -frequently drink alcoholic beverages -history of stroke, TIA -kidney disease -liver disease -muscle aches or weakness -other medical condition -an unusual or allergic reaction to atorvastatin, other medicines, foods, dyes, or preservatives -pregnant or trying to get pregnant -breast-feeding How should I use this medicine? Take this medicine by mouth with a glass of water. Follow the directions on the prescription label. You can take this medicine with or without food. Take your doses at regular intervals. Do not take your medicine more often than directed. Talk to your pediatrician regarding the use of this  medicine in children. While this drug may be prescribed for children as young as 9 years old for selected conditions, precautions do apply. Overdosage: If you think you have taken too much of this medicine contact a poison control center or emergency room at once. NOTE: This medicine is only for you. Do not share this medicine with others. What if I miss a dose? If you miss a dose, take it as soon as you can. If it is almost time for your next dose, take only that dose. Do not take double or extra doses. What may interact with this medicine? Do not take this medicine with any of the following medications: -red yeast rice -telaprevir -telithromycin -voriconazole This medicine may also interact with the following medications: -alcohol -antiviral medicines for HIV or AIDS -boceprevir -certain antibiotics like clarithromycin, erythromycin, troleandomycin -certain medicines  for cholesterol like fenofibrate or gemfibrozil -cimetidine -clarithromycin -colchicine -cyclosporine -digoxin -male hormones, like estrogens or progestins and birth control pills -grapefruit juice -medicines for fungal infections like fluconazole, itraconazole, ketoconazole -niacin -rifampin -spironolactone This list may not describe all possible interactions. Give your health care provider a list of all the medicines, herbs, non-prescription drugs, or dietary supplements you use. Also tell them if you smoke, drink alcohol, or use illegal drugs. Some items may interact with your medicine. What should I watch for while using this medicine? Visit your doctor or health care professional for regular check-ups. You may need regular tests to make sure your liver is working properly. Tell your doctor or health care professional right away if you get any unexplained muscle pain, tenderness, or weakness, especially if you also have a fever and tiredness. Your doctor or health care professional may tell you to stop taking this  medicine if you develop muscle problems. If your muscle problems do not go away after stopping this medicine, contact your health care professional. This drug is only part of a total heart-health program. Your doctor or a dietician can suggest a low-cholesterol and low-fat diet to help. Avoid alcohol and smoking, and keep a proper exercise schedule. Do not use this drug if you are pregnant or breast-feeding. Serious side effects to an unborn child or to an infant are possible. Talk to your doctor or pharmacist for more information. This medicine may affect blood sugar levels. If you have diabetes, check with your doctor or health care professional before you change your diet or the dose of your diabetic medicine. If you are going to have surgery tell your health care professional that you are taking this drug. What side effects may I notice from receiving this medicine? Side effects that you should report to your doctor or health care professional as soon as possible: -allergic reactions like skin rash, itching or hives, swelling of the face, lips, or tongue -dark urine -fever -joint pain -muscle cramps, pain -redness, blistering, peeling or loosening of the skin, including inside the mouth -trouble passing urine or change in the amount of urine -unusually weak or tired -yellowing of eyes or skin Side effects that usually do not require medical attention (report to your doctor or health care professional if they continue or are bothersome): -constipation -heartburn -stomach gas, pain, upset This list may not describe all possible side effects. Call your doctor for medical advice about side effects. You may report side effects to FDA at 1-800-FDA-1088. Where should I keep my medicine? Keep out of the reach of children. Store at room temperature between 20 to 25 degrees C (68 to 77 degrees F). Throw away any unused medicine after the expiration date. NOTE: This sheet is a summary. It may not  cover all possible information. If you have questions about this medicine, talk to your doctor, pharmacist, or health care provider.  2018 Elsevier/Gold Standard (2011-06-06 09:18:24)   IF you received an x-ray today, you will receive an invoice from [facility]. Please contact [facility] at [phone] with questions or concerns regarding your invoice.   IF you received labwork today, you will receive an invoice from [place]. Please contact [facility] at [phone] with questions or concerns regarding your invoice.   Our billing staff will not be able to assist you with questions regarding bills from these companies.  You will be contacted with the lab results as soon as they are available. The fastest way to get your results is  to activate your My Chart account. Instructions are located on the last page of this paperwork. If you have not heard from [place] regarding the results in 2 weeks, please contact this office.

## 2017-11-23 LAB — TSH: TSH: 0.538 u[IU]/mL (ref 0.450–4.500)

## 2017-11-27 ENCOUNTER — Encounter: Payer: Self-pay | Admitting: Physician Assistant

## 2017-11-29 ENCOUNTER — Other Ambulatory Visit: Payer: Self-pay | Admitting: Physician Assistant

## 2017-11-29 MED ORDER — ATORVASTATIN CALCIUM 20 MG PO TABS
20.0000 mg | ORAL_TABLET | Freq: Every day | ORAL | 3 refills | Status: DC
Start: 2017-11-29 — End: 2020-01-07

## 2017-11-29 NOTE — Progress Notes (Signed)
Meds ordered this encounter  Medications  . atorvastatin (LIPITOR) 20 MG tablet    Sig: Take 1 tablet (20 mg total) by mouth daily.    Dispense:  90 tablet    Refill:  3    Order Specific Question:   Supervising Provider    Answer:   SMITH, KRISTI M [[id number]]    

## 2017-12-03 ENCOUNTER — Telehealth: Payer: Self-pay | Admitting: Physician Assistant

## 2017-12-03 NOTE — Telephone Encounter (Signed)
Pt given results per notes of [place]  on 5/2/19Unable to document in result note due to result note not being routed to [facility].

## 2017-12-07 ENCOUNTER — Ambulatory Visit (INDEPENDENT_AMBULATORY_CARE_PROVIDER_SITE_OTHER): Payer: BLUE CROSS/BLUE SHIELD | Admitting: Urgent Care

## 2017-12-07 ENCOUNTER — Encounter: Payer: Self-pay | Admitting: Urgent Care

## 2017-12-07 ENCOUNTER — Other Ambulatory Visit: Payer: Self-pay

## 2017-12-07 VITALS — BP 90/58 | HR 72 | Temp 98.2°F | Resp 16 | Ht 70.67 in | Wt 149.0 lb

## 2017-12-07 DIAGNOSIS — I10 Essential (primary) hypertension: Secondary | ICD-10-CM

## 2017-12-07 DIAGNOSIS — I959 Hypotension, unspecified: Secondary | ICD-10-CM

## 2017-12-07 DIAGNOSIS — R51 Headache: Secondary | ICD-10-CM

## 2017-12-07 DIAGNOSIS — R42 Dizziness and giddiness: Secondary | ICD-10-CM | POA: Diagnosis not present

## 2017-12-07 DIAGNOSIS — R519 Headache, unspecified: Secondary | ICD-10-CM

## 2017-12-07 NOTE — Progress Notes (Signed)
    MRN: [id number] DOB: 15-May-1976  Subjective:   Allen Ramos is a 42 y.o. male presenting for low blood pressure.  Reports that his pressure has been dropping below 100.  He admits that he has been fasting for Ramadan.  He has had some dizziness and generalized headache.  Denies fever, confusion, chest pain, heart racing, palpitations, nausea, vomiting, belly pain, hematuria.  Denies smoking cigarettes and does not drink alcohol.  He is eating 2 meals a day prior to 5 AM and after 8 PM.  Holden has a current medication list which includes the following prescription(s): atorvastatin, cetirizine, clotrimazole, fluticasone, levothyroxine, and lisinopril. Also has No Known Allergies.  Allen Ramos  has a past medical history of Back pain. Also  has no past surgical history on file.  Objective:   Vitals: BP (!) 90/58   Pulse 72   Temp 98.2 F (36.8 C) (Oral)   Resp 16   Ht 5' 10.67" (1.795 m)   Wt 149 lb (67.6 kg)   SpO2 99%   BMI 20.98 kg/m   BP Readings from Last 3 Encounters:  12/07/17 (!) 90/58  11/22/17 118/72  09/03/17 (!) 122/58   Physical Exam  Constitutional: He is oriented to person, place, and time. He appears well-developed and well-nourished.  HENT:  Mouth/Throat: Mucous membranes are dry.  Eyes: Pupils are equal, round, and reactive to light. EOM are normal. Right eye exhibits no discharge. Left eye exhibits no discharge. No scleral icterus.  Cardiovascular: Normal rate, regular rhythm and intact distal pulses. Exam reveals no gallop and no friction rub.  No murmur heard. Pulmonary/Chest: No respiratory distress. He has no wheezes. He has no rales.  Neurological: He is alert and oriented to person, place, and time. He displays normal reflexes. No cranial nerve deficit. Coordination normal.  Skin: Skin is warm and dry.  Psychiatric: He has a normal mood and affect.    Assessment and Plan :   Dizziness  Generalized headache  Hypotension, unspecified hypotension  type  Essential hypertension  Patient's physical exam findings are reassuring.  I counseled on avoiding dehydration during his fasting.  He agreed to hydrate throughout the day with water.  He will continue to eat his meal at 5 AM and 8 PM.  For now we will hold off on his lisinopril.  He is to monitor blood pressure at home, parameters given for return to clinic visit.  Otherwise he can follow-up after he completes Ramadan for recheck on his blood pressure and whether or not we should continue lisinopril.  Wallis Bamberg, PA-C [facility] [phone] 12/07/2017  4:06 PM

## 2017-12-07 NOTE — Patient Instructions (Addendum)
Eat a large breakfast when you wake up. Throughout the day while you are fasting (not eating food), make sure you hydrate well with at least 2 liters of water per day. Then at night, when you are done fasting, you can eat your second meal of the day. For now, do not take lisinopril. This is a blood pressure medication that may be lowering your blood pressure too much. Measure your blood pressure around the same time each day. It is important for it to range between 110-130 systolic (top number). Come back if your symptoms persist. Once you are done with Ramadan, then come back for a recheck to see if we need restart your blood pressure medication, lisinopril.     IF you received an x-ray today, you will receive an invoice from [facility]. Please contact [facility] at [phone] with questions or concerns regarding your invoice.   IF you received labwork today, you will receive an invoice from [place]. Please contact [facility] at [phone] with questions or concerns regarding your invoice.   Our billing staff will not be able to assist you with questions regarding bills from these companies.  You will be contacted with the lab results as soon as they are available. The fastest way to get your results is to activate your My Chart account. Instructions are located on the last page of this paperwork. If you have not heard from [place] regarding the results in 2 weeks, please contact this office.

## 2017-12-16 ENCOUNTER — Other Ambulatory Visit: Payer: Self-pay | Admitting: Physician Assistant

## 2018-01-10 ENCOUNTER — Ambulatory Visit (INDEPENDENT_AMBULATORY_CARE_PROVIDER_SITE_OTHER): Payer: BLUE CROSS/BLUE SHIELD | Admitting: Physician Assistant

## 2018-01-10 ENCOUNTER — Encounter: Payer: Self-pay | Admitting: Physician Assistant

## 2018-01-10 ENCOUNTER — Other Ambulatory Visit: Payer: Self-pay

## 2018-01-10 VITALS — BP 118/68 | HR 88 | Temp 98.5°F | Resp 20 | Ht 70.32 in | Wt 149.6 lb

## 2018-01-10 DIAGNOSIS — I10 Essential (primary) hypertension: Secondary | ICD-10-CM | POA: Diagnosis not present

## 2018-01-10 LAB — POCT URINALYSIS DIP (MANUAL ENTRY)
Bilirubin, UA: NEGATIVE
Glucose, UA: NEGATIVE mg/dL
Ketones, POC UA: NEGATIVE mg/dL
Leukocytes, UA: NEGATIVE
NITRITE UA: NEGATIVE
PH UA: 6 (ref 5.0–8.0)
Protein Ur, POC: NEGATIVE mg/dL
SPEC GRAV UA: 1.02 (ref 1.010–1.025)
UROBILINOGEN UA: 0.2 U/dL

## 2018-01-10 NOTE — Patient Instructions (Addendum)
For now, hold off on taking lisinopril as your blood pressure is well controlled. Check blood pressure outside of office at least 3 times a week over the next few weeks. Goal is <140/90 and >100/60. If ever consistently >140/90, start bp medication and make appointment with me. Otherwise, if it remains within goal, return in 5 months for reevaluation. Thank you for letting me participate in your health and well being.     How to Take Your Blood Pressure You can take your blood pressure at home with a machine. You may need to check your blood pressure at home:  To check if you have high blood pressure (hypertension).  To check your blood pressure over time.  To make sure your blood pressure medicine is working.  Supplies needed: You will need a blood pressure machine, or monitor. You can buy one at a drugstore or online. When choosing one:  Choose one with an arm cuff.  Choose one that wraps around your upper arm. Only one finger should fit between your arm and the cuff.  Do not choose one that measures your blood pressure from your wrist or finger.  Your doctor can suggest a monitor. How to prepare Avoid these things for 30 minutes before checking your blood pressure:  Drinking caffeine.  Drinking alcohol.  Eating.  Smoking.  Exercising.  Five minutes before checking your blood pressure:  Pee.  Sit in a dining chair. Avoid sitting in a soft couch or armchair.  Be quiet. Do not talk.  How to take your blood pressure Follow the instructions that came with your machine. If you have a digital blood pressure monitor, these may be the instructions: 1. Sit up straight. 2. Place your feet on the floor. Do not cross your ankles or legs. 3. Rest your left arm at the level of your heart. You may rest it on a table, desk, or chair. 4. Pull up your shirt sleeve. 5. Wrap the blood pressure cuff around the upper part of your left arm. The cuff should be 1 inch (2.5 cm) above your  elbow. It is best to wrap the cuff around bare skin. 6. Fit the cuff snugly around your arm. You should be able to place only one finger between the cuff and your arm. 7. Put the cord inside the groove of your elbow. 8. Press the power button. 9. Sit quietly while the cuff fills with air and loses air. 10. Write down the numbers on the screen. 11. Wait 2-3 minutes and then repeat steps 1-10.  What do the numbers mean? Two numbers make up your blood pressure. The first number is called systolic pressure. The second is called diastolic pressure. An example of a blood pressure reading is "120 over 80" (or 120/80). If you are an adult and do not have a medical condition, use this guide to find out if your blood pressure is normal: Normal  First number: below 120.  Second number: below 80. Elevated  First number: 120-129.  Second number: below 80. Hypertension stage 1  First number: 130-139.  Second number: 80-89. Hypertension stage 2  First number: 140 or above.  Second number: 90 or above. Your blood pressure is above normal even if only the top or bottom number is above normal. Follow these instructions at home:  Check your blood pressure as often as your doctor tells you to.  Take your monitor to your next doctor's appointment. Your doctor will: ? Make sure you are using it  correctly. ? Make sure it is working right.  Make sure you understand what your blood pressure numbers should be.  Tell your doctor if your medicines are causing side effects. Contact a doctor if:  Your blood pressure keeps being high. Get help right away if:  Your first blood pressure number is higher than 180.  Your second blood pressure number is higher than 120. This information is not intended to replace advice given to you by your health care provider. Make sure you discuss any questions you have with your health care provider. Document Released: 06/29/2008 Document Revised: 06/14/2016  Document Reviewed: 12/24/2015 Elsevier Interactive Patient Education  2018 [employer]  Preventing Hypertension Hypertension, commonly called high blood pressure, is when the force of blood pumping through the arteries is too strong. Arteries are blood vessels that carry blood from the heart throughout the body. Over time, hypertension can damage the arteries and decrease blood flow to important parts of the body, including the brain, heart, and kidneys. Often, hypertension does not cause symptoms until blood pressure is very high. For this reason, it is important to have your blood pressure checked on a regular basis. Hypertension can often be prevented with diet and lifestyle changes. If you already have hypertension, you can control it with diet and lifestyle changes, as well as medicine. What nutrition changes can be made? Maintain a healthy diet. This includes:  Eating less salt (sodium). Ask your health care provider how much sodium is safe for you to have. The general recommendation is to consume less than 1 tsp (2,300 mg) of sodium a day. ? Do not add salt to your food. ? Choose low-sodium options when grocery shopping and eating out.  Limiting fats in your diet. You can do this by eating low-fat or fat-free dairy products and by eating less red meat.  Eating more fruits, vegetables, and whole grains. Make a goal to eat: ? 1-2 cups of fresh fruits and vegetables each day. ? 3-4 servings of whole grains each day.  Avoiding foods and beverages that have added sugars.  Eating fish that contain healthy fats (omega-3 fatty acids), such as mackerel or salmon.  If you need help putting together a healthy eating plan, try the DASH diet. This diet is high in fruits, vegetables, and whole grains. It is low in sodium, red meat, and added sugars. DASH stands for Dietary Approaches to Stop Hypertension. What lifestyle changes can be made?  Lose weight if you are overweight. Losing just 3?5%  of your body weight can help prevent or control hypertension. ? For example, if your present weight is 200 lb (91 kg), a loss of 3-5% of your weight means losing 6-10 lb (2.7-4.5 kg). ? Ask your health care provider to help you with a diet and exercise plan to safely lose weight.  Get enough exercise. Do at least 150 minutes of moderate-intensity exercise each week. ? You could do this in short exercise sessions several times a day, or you could do longer exercise sessions a few times a week. For example, you could take a brisk 10-minute walk or bike ride, 3 times a day, for 5 days a week.  Find ways to reduce stress, such as exercising, meditating, listening to music, or taking a yoga class. If you need help reducing stress, ask your health care provider.  Do not smoke. This includes e-cigarettes. Chemicals in tobacco and nicotine products raise your blood pressure each time you smoke. If you need help quitting,  ask your health care provider.  Avoid alcohol. If you drink alcohol, limit alcohol intake to no more than 1 drink a day for nonpregnant women and 2 drinks a day for men. One drink equals 12 oz of beer, 5 oz of wine, or 1 oz of hard liquor. Why are these changes important? Diet and lifestyle changes can help you prevent hypertension, and they may make you feel better overall and improve your quality of life. If you have hypertension, making these changes will help you control it and help prevent major complications, such as:  Hardening and narrowing of arteries that supply blood to: ? Your heart. This can cause a heart attack. ? Your brain. This can cause a stroke. ? Your kidneys. This can cause kidney failure.  Stress on your heart muscle, which can cause heart failure.  What can I do to lower my risk?  Work with your health care provider to make a hypertension prevention plan that works for you. Follow your plan and keep all follow-up visits as told by your health care  provider.  Learn how to check your blood pressure at home. Make sure that you know your personal target blood pressure, as told by your health care provider. How is this treated? In addition to diet and lifestyle changes, your health care provider may recommend medicines to help lower your blood pressure. You may need to try a few different medicines to find what works best for you. You also may need to take more than one medicine. Take over-the-counter and prescription medicines only as told by your health care provider. Where to find support: Your health care provider can help you prevent hypertension and help you keep your blood pressure at a healthy level. Your local hospital or your community may also provide support services and prevention programs. The American Heart Association offers an online support network at: https://www.lee.net/ Where to find more information: Learn more about hypertension from:  National Heart, Lung, and Blood Institute: https://www.peterson.org/  Centers for Disease Control and Prevention: AboutHD.co.nz  American Academy of Family Physicians: http://familydoctor.org/familydoctor/en/diseases-conditions/high-blood-pressure.printerview.all.html  Learn more about the DASH diet from:  National Heart, Lung, and Blood Institute: WedMap.it  Contact a health care provider if:  You think you are having a reaction to medicines you have taken.  You have recurrent headaches or feel dizzy.  You have swelling in your ankles.  You have trouble with your vision. Summary  Hypertension often does not cause any symptoms until blood pressure is very high. It is important to get your blood pressure checked regularly.  Diet and lifestyle changes are the most important steps in preventing hypertension.  By keeping your blood pressure in a healthy range, you can  prevent complications like heart attack, heart failure, stroke, and kidney failure.  Work with your health care provider to make a hypertension prevention plan that works for you. This information is not intended to replace advice given to you by your health care provider. Make sure you discuss any questions you have with your health care provider. Document Released: 08/01/2015 Document Revised: 03/27/2016 Document Reviewed: 03/27/2016 Elsevier Interactive Patient Education  2018 [employer].   IF you received an x-ray today, you will receive an invoice from [facility]. Please contact [facility] at [phone] with questions or concerns regarding your invoice.   IF you received labwork today, you will receive an invoice from [place]. Please contact [facility] at [phone] with questions or concerns regarding your invoice.   Our billing staff  will not be able to assist you with questions regarding bills from these companies.  You will be contacted with the lab results as soon as they are available. The fastest way to get your results is to activate your My Chart account. Instructions are located on the last page of this paperwork. If you have not heard from [place] regarding the results in 2 weeks, please contact this office.

## 2018-01-10 NOTE — Progress Notes (Signed)
MRN: [id number] DOB: 09-08-1975  Subjective:   Allen Ramos is a 42 y.o. male presenting for follow up on Hypertension.  He used to be controlled on lisinopril 10 mg daily.  However, patient just completed fasting for Ramadan and ended up having low blood pressure readings during Ramadan.  He was evaluated in clinic on 12/07/17 with bp of 90/58 and recommended that he discontinue lisinopril daily.  He has not taken it since.  Completed Ramadan about 1 week ago.  He has not checked his blood pressure since. Denies lightheadedness, dizziness, chronic headache, double vision, chest pain, shortness of breath, heart racing, palpitations, nausea, vomiting, abdominal pain, hematuria, lower leg swelling. Lifestyle: Avoiding excessive salt intake. Diet consists of lots of red meat and rice.  Work is strenuous. Denies smoking. Denies any other aggravating or relieving factors, no other questions or concerns.    Aariz has a current medication list which includes the following prescription(s): atorvastatin, cetirizine, clotrimazole, fluticasone, levothyroxine, and lisinopril. Also has No Known Allergies.  Selah  has a past medical history of Back pain. Also  has no past surgical history on file.   Objective:   Vitals: BP 118/68 (BP Location: Left Arm, Patient Position: Sitting, Cuff Size: Normal)   Pulse 88   Temp 98.5 F (36.9 C) (Oral)   Resp 20   Ht 5' 10.32" (1.786 m)   Wt 149 lb 9.6 oz (67.9 kg)   SpO2 99%   BMI 21.27 kg/m   Physical Exam  Constitutional: He is oriented to person, place, and time. He appears well-developed and well-nourished. No distress.  HENT:  Head: Normocephalic and atraumatic.  Mouth/Throat: Uvula is midline, oropharynx is clear and moist and mucous membranes are normal. No tonsillar exudate.  Eyes: Pupils are equal, round, and reactive to light. Conjunctivae and EOM are normal.  Neck: Normal range of motion.  Cardiovascular: Normal rate, regular rhythm, normal  heart sounds and intact distal pulses.  Pulmonary/Chest: Effort normal and breath sounds normal. He has no decreased breath sounds. He has no wheezes. He has no rhonchi. He has no rales.  Musculoskeletal:       Right lower leg: He exhibits no swelling.       Left lower leg: He exhibits no swelling.  Neurological: He is alert and oriented to person, place, and time.  Skin: Skin is warm and dry.  Psychiatric: He has a normal mood and affect.  Vitals reviewed.    Wt Readings from Last 3 Encounters:  01/10/18 149 lb 9.6 oz (67.9 kg)  12/07/17 149 lb (67.6 kg)  11/22/17 151 lb 6.4 oz (68.7 kg)   BP Readings from Last 3 Encounters:  01/10/18 118/68  12/07/17 (!) 90/58  11/22/17 118/72    No results found for this or any previous visit (from the past 24 hour(s)).  Assessment and Plan :  1. Essential hypertension BP is well controlled without use of medication.  Recommended continuing lifestyle modifications.  Encouraged a heart healthy diet and decreasing red meat and rice consumption. Given info on DASH diet. Would hold off on lisinopril at this time.  Recommended to start checking blood pressure outside the office.  Goal is <140/90 and >100/60.  If consistently within this range, continue with current plan.  However if consistently >140/90, start taking lisinopril 10 mg daily and make an appointment for follow-up.  Otherwise follow-up in 5 months for reevaluation.  We will repeat lipid panel this time. - CMP14+EGFR - CBC with Differential/Platelet -  POCT urinalysis dipstick - Urine Microscopic   Tenna Delaine, PA-C  [facility] [place] 01/10/2018 12:04 PM

## 2018-01-11 LAB — CMP14+EGFR
A/G RATIO: 1.5 (ref 1.2–2.2)
ALBUMIN: 4.4 g/dL (ref 3.5–5.5)
ALT: 18 IU/L (ref 0–44)
AST: 15 IU/L (ref 0–40)
Alkaline Phosphatase: 73 IU/L (ref 39–117)
BUN / CREAT RATIO: 8 — AB (ref 9–20)
BUN: 8 mg/dL (ref 6–24)
Bilirubin Total: 0.2 mg/dL (ref 0.0–1.2)
CALCIUM: 9.5 mg/dL (ref 8.7–10.2)
CO2: 24 mmol/L (ref 20–29)
Chloride: 100 mmol/L (ref 96–106)
Creatinine, Ser: 1.05 mg/dL (ref 0.76–1.27)
GFR calc non Af Amer: 87 mL/min/{1.73_m2} (ref >59)
GFR, EST AFRICAN AMERICAN: 100 mL/min/{1.73_m2} (ref >59)
GLUCOSE: 118 mg/dL — AB (ref 65–99)
Globulin, Total: 2.9 g/dL (ref 1.5–4.5)
Potassium: 4.3 mmol/L (ref 3.5–5.2)
Sodium: 140 mmol/L (ref 134–144)
Total Protein: 7.3 g/dL (ref 6.0–8.5)

## 2018-01-11 LAB — CBC WITH DIFFERENTIAL/PLATELET
BASOS ABS: 0 10*3/uL (ref 0.0–0.2)
BASOS: 1 %
EOS (ABSOLUTE): 0.3 10*3/uL (ref 0.0–0.4)
Eos: 6 %
Hematocrit: 40.8 % (ref 37.5–51.0)
Hemoglobin: 13.8 g/dL (ref 13.0–17.7)
IMMATURE GRANULOCYTES: 0 %
Immature Grans (Abs): 0 10*3/uL (ref 0.0–0.1)
LYMPHS: 33 %
Lymphocytes Absolute: 1.7 10*3/uL (ref 0.7–3.1)
MCH: 30.5 pg (ref 26.6–33.0)
MCHC: 33.8 g/dL (ref 31.5–35.7)
MCV: 90 fL (ref 79–97)
MONOCYTES: 6 %
Monocytes Absolute: 0.3 10*3/uL (ref 0.1–0.9)
NEUTROS ABS: 2.8 10*3/uL (ref 1.4–7.0)
Neutrophils: 54 %
PLATELETS: 306 10*3/uL (ref 150–450)
RBC: 4.52 x10E6/uL (ref 4.14–5.80)
RDW: 13.5 % (ref 12.3–15.4)
WBC: 5.2 10*3/uL (ref 3.4–10.8)

## 2018-01-11 LAB — URINALYSIS, MICROSCOPIC ONLY
Casts: NONE SEEN /lpf
Epithelial Cells (non renal): NONE SEEN /hpf (ref 0–10)

## 2018-01-15 ENCOUNTER — Telehealth: Payer: Self-pay | Admitting: Physician Assistant

## 2018-01-15 ENCOUNTER — Telehealth (INDEPENDENT_AMBULATORY_CARE_PROVIDER_SITE_OTHER): Payer: Self-pay | Admitting: Physician Assistant

## 2018-01-15 NOTE — Telephone Encounter (Signed)
Copied from CRM [phone]. Topic: Quick Communication - Lab Results >> Jan 15, 2018 11:19 AM Allen Ramos, Davina J, CMA wrote: Called patient to inform them of  lab results. When patient returns call, triage nurse may disclose results.  Please call patient and report his blood count, urine, electrolytes, liver enzymes and kidney function were normal. His blood sugar was mildly elevated at 118, which could be normal if he was not fasting that day. If he was fasting and had not eaten anything, please let me know. >> Jan 15, 2018  4:16 PM Cipriano BunkerLambe, Annette S wrote:  Pt calling back again about lab results.  Please call him  Please call [phone]

## 2018-01-15 NOTE — Telephone Encounter (Unsigned)
Copied from CRM [id number]. Topic: Quick Communication - Lab Results >> Jan 15, 2018 10:44 AM Trudi IdaMabry, Jasmine L, [place] wrote: Called patient to inform them of 01/10/18 lab results. When patient returns call, triage nurse may disclose results.

## 2018-01-16 NOTE — Telephone Encounter (Signed)
Left message to call back regarding lab results- see lab in chart for results

## 2018-01-16 NOTE — Telephone Encounter (Signed)
Call patient to inform of lab results- left message to call back.

## 2018-02-27 ENCOUNTER — Telehealth: Payer: Self-pay | Admitting: Physician Assistant

## 2018-02-27 NOTE — Telephone Encounter (Signed)
Pt given results per notes of BeninBrittany Wiseman,PA on 01/14/18. Pt verbalized understanding. Pt states is he not sure if was fasting at the time of lab draw, but states he doesn't think he was.Unable to document in result note due to result note not being routed to [facility].

## 2018-02-27 NOTE — Telephone Encounter (Signed)
Copied from CRM [id number]. Topic: Quick Communication - Lab Results >> Jan 15, 2018 11:19 AM Isaac BlissGalloway, Davina J, CMA wrote: Called patient to inform them of  lab results. When patient returns call, triage nurse may disclose results.  Please call patient and report his blood count, urine, electrolytes, liver enzymes and kidney function were normal. His blood sugar was mildly elevated at 118, which could be normal if he was not fasting that day. If he was fasting and had not eaten anything, please let me know. >> Jan 15, 2018  4:16 PM Cipriano BunkerLambe, Annette S wrote:  Pt calling back again about lab results.  Please call him  Please call [phone] >> Feb 27, 2018  4:49 PM Arlyss Gandyichardson, Sherolyn Trettin N, NT wrote: Pt states that he is calling to get his lab results. He is available daily after 2:30pm.

## 2019-12-02 IMAGING — CR DG CHEST 2V
2 series · 2 of 2 positions shown · non-contrast
Comparison: 06/18/2009

CLINICAL DATA: Chest pain for 3 days

EXAM:
CHEST  2 VIEW

[w chest pa]
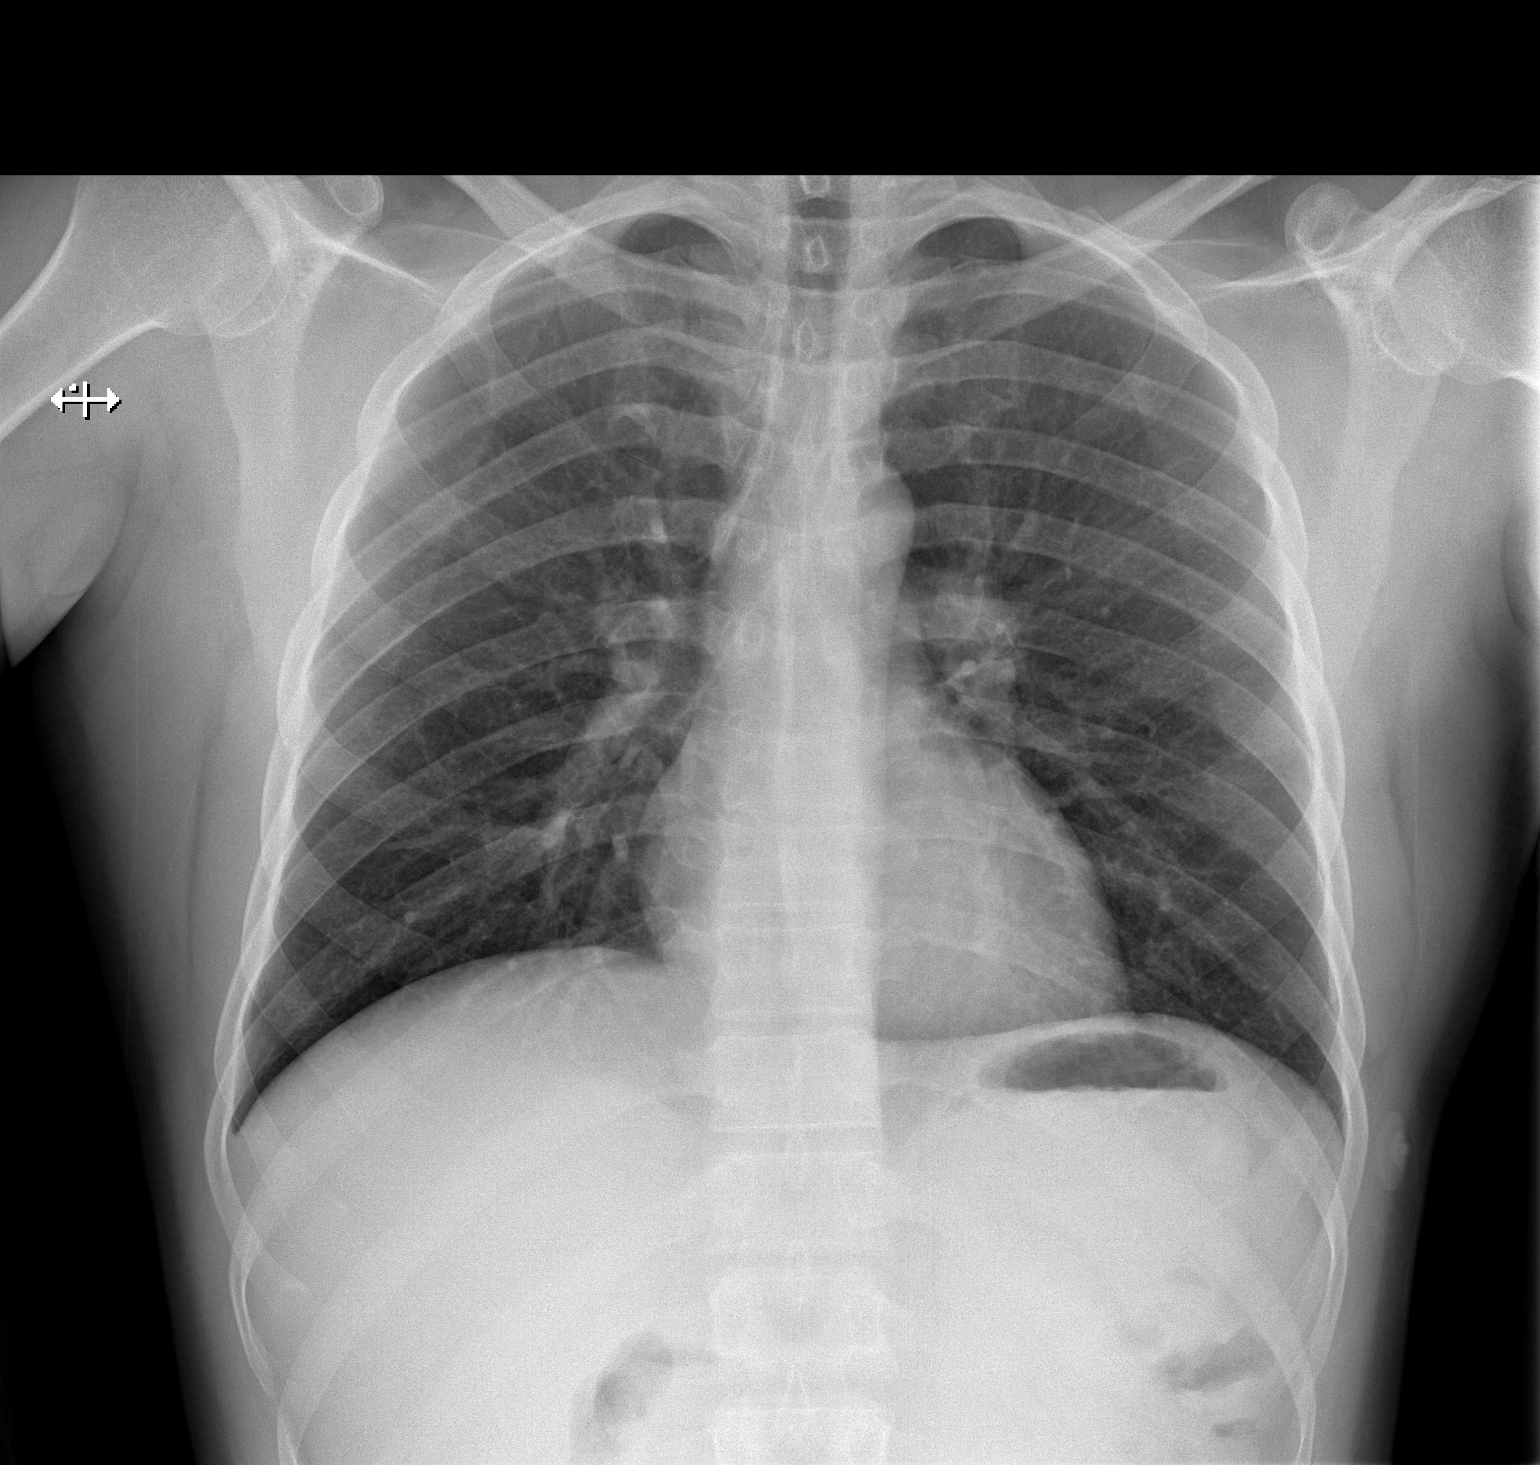

[w chest lat]
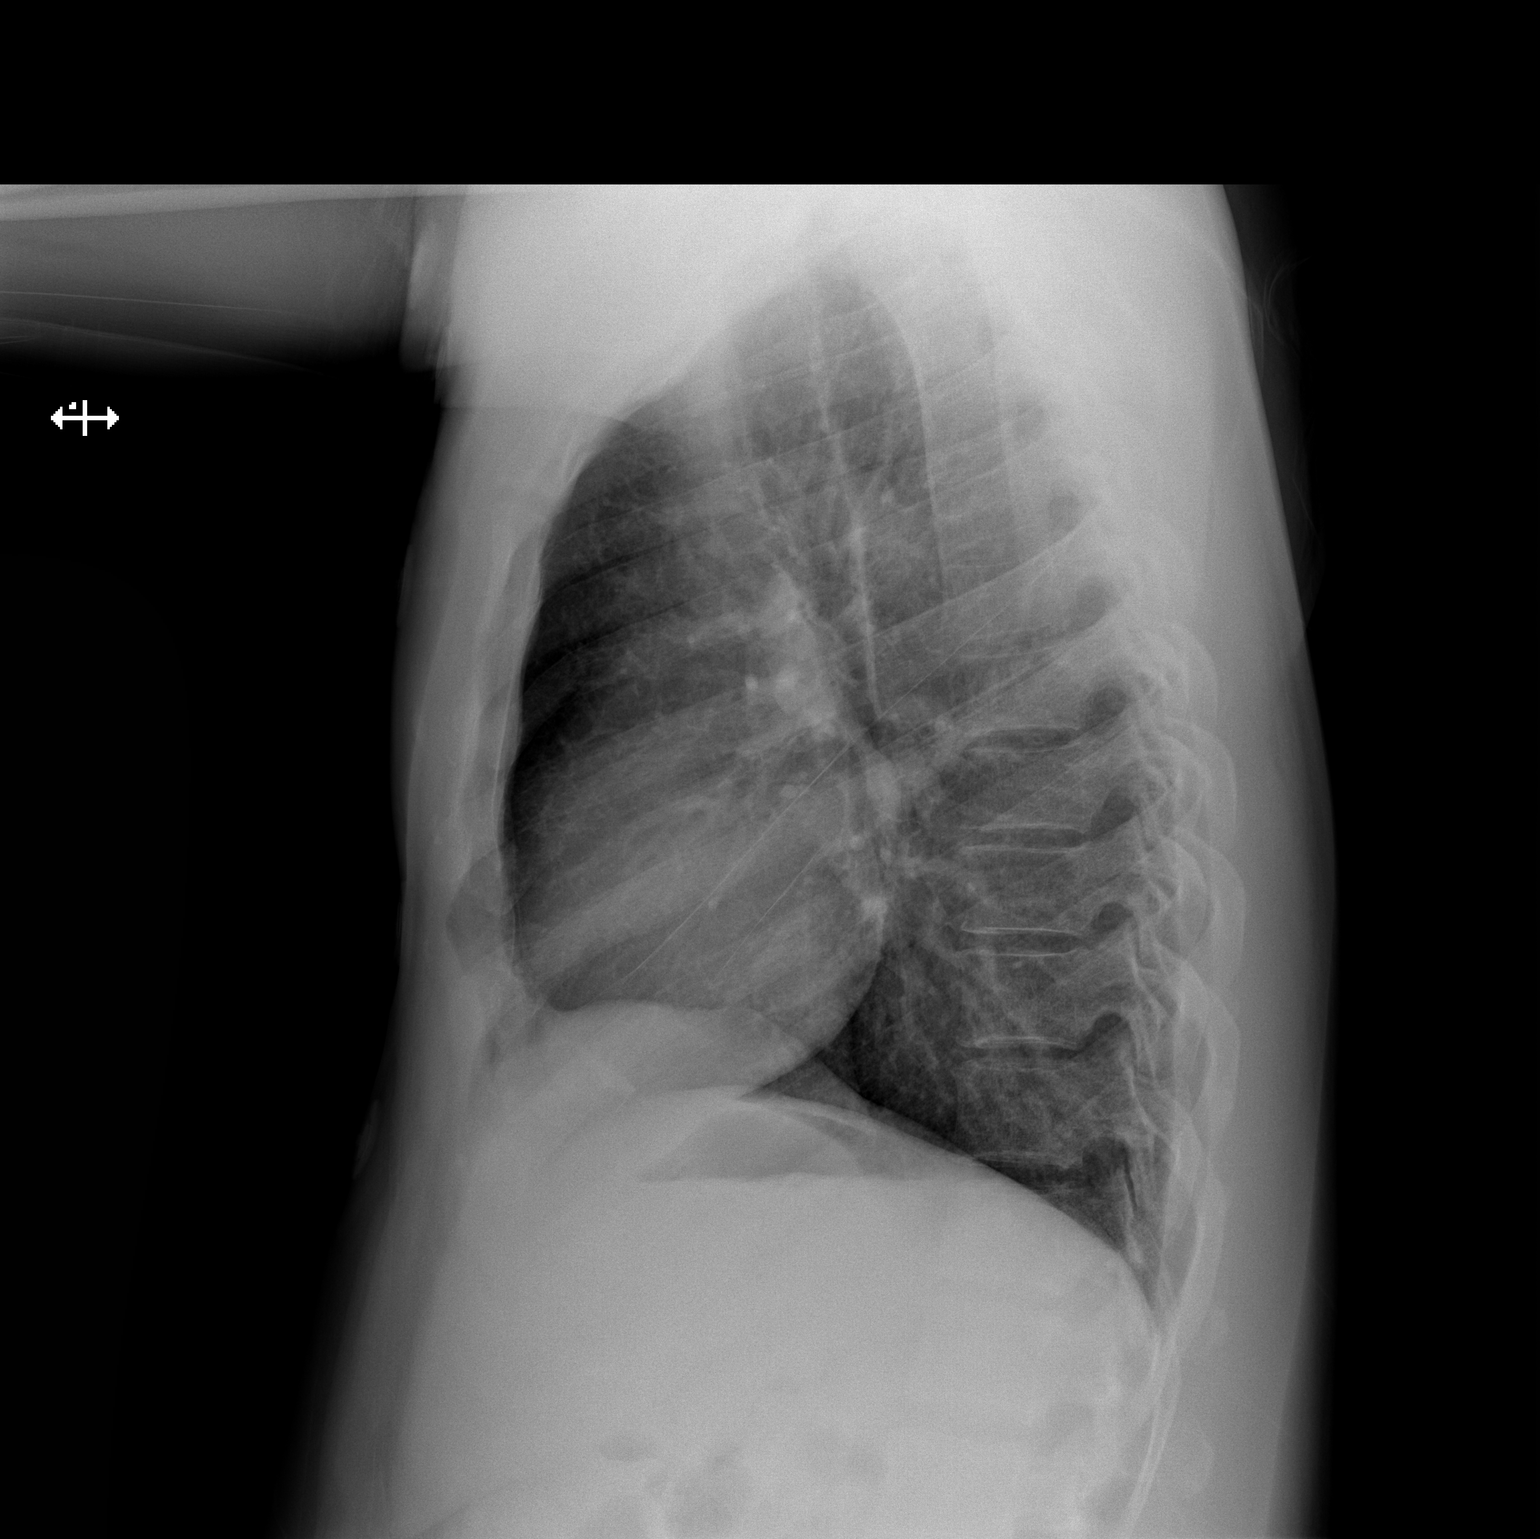

[2 of 2 positions shown; findings below may reference images not displayed]

FINDINGS: The heart size and mediastinal contours are within normal limits.
Both lungs are clear. The visualized skeletal structures are
unremarkable.
IMPRESSION: No active cardiopulmonary disease.

## 2020-01-07 ENCOUNTER — Ambulatory Visit (HOSPITAL_COMMUNITY)
Admission: EM | Admit: 2020-01-07 | Discharge: 2020-01-07 | Disposition: A | Discharge status: Home / Self Care | Payer: 59 | Attending: Urgent Care | Admitting: Urgent Care

## 2020-01-07 ENCOUNTER — Ambulatory Visit (INDEPENDENT_AMBULATORY_CARE_PROVIDER_SITE_OTHER): Payer: 59

## 2020-01-07 ENCOUNTER — Other Ambulatory Visit: Payer: Self-pay

## 2020-01-07 ENCOUNTER — Encounter (HOSPITAL_COMMUNITY): Payer: Self-pay | Admitting: Emergency Medicine

## 2020-01-07 ENCOUNTER — Other Ambulatory Visit (HOSPITAL_COMMUNITY): Payer: Self-pay | Admitting: Urgent Care

## 2020-01-07 DIAGNOSIS — E039 Hypothyroidism, unspecified: Secondary | ICD-10-CM

## 2020-01-07 DIAGNOSIS — I10 Essential (primary) hypertension: Secondary | ICD-10-CM | POA: Diagnosis present

## 2020-01-07 DIAGNOSIS — R1084 Generalized abdominal pain: Secondary | ICD-10-CM

## 2020-01-07 DIAGNOSIS — K5900 Constipation, unspecified: Secondary | ICD-10-CM

## 2020-01-07 DIAGNOSIS — R300 Dysuria: Secondary | ICD-10-CM

## 2020-01-07 DIAGNOSIS — E785 Hyperlipidemia, unspecified: Secondary | ICD-10-CM | POA: Insufficient documentation

## 2020-01-07 DIAGNOSIS — R109 Unspecified abdominal pain: Secondary | ICD-10-CM

## 2020-01-07 HISTORY — DX: Pure hypercholesterolemia, unspecified: E78.00

## 2020-01-07 HISTORY — DX: Disorder of thyroid, unspecified: E07.9

## 2020-01-07 HISTORY — DX: Essential (primary) hypertension: I10

## 2020-01-07 LAB — POCT URINALYSIS DIP (DEVICE)
Bilirubin Urine: NEGATIVE
Glucose, UA: NEGATIVE mg/dL
Ketones, ur: NEGATIVE mg/dL
Leukocytes,Ua: NEGATIVE
Nitrite: NEGATIVE
Protein, ur: NEGATIVE mg/dL
Specific Gravity, Urine: 1.025 (ref 1.005–1.030)
Urobilinogen, UA: 0.2 mg/dL (ref 0.0–1.0)
pH: 7 (ref 5.0–8.0)

## 2020-01-07 LAB — TSH: TSH: 33.371 u[IU]/mL — ABNORMAL HIGH (ref 0.350–4.500)

## 2020-01-07 LAB — CBG MONITORING, ED: Glucose-Capillary: 98 mg/dL (ref 70–99)

## 2020-01-07 LAB — T4, FREE: Free T4: 0.42 ng/dL — ABNORMAL LOW (ref 0.61–1.12)

## 2020-01-07 MED ORDER — LISINOPRIL 10 MG PO TABS: 10.0000 mg | ORAL_TABLET | Freq: Every day | ORAL | 0 refills | Status: AC

## 2020-01-07 MED ORDER — ATORVASTATIN CALCIUM 20 MG PO TABS: 20.0000 mg | ORAL_TABLET | Freq: Every day | ORAL | 0 refills | Status: AC

## 2020-01-07 MED ORDER — POLYETHYLENE GLYCOL 3350 17 G PO PACK: 17.0000 g | PACK | Freq: Every day | ORAL | 0 refills | Status: AC | PRN

## 2020-01-07 MED ORDER — LEVOTHYROXINE SODIUM 75 MCG PO TABS: 75.0000 ug | ORAL_TABLET | Freq: Every day | ORAL | 0 refills | Status: AC

## 2020-01-07 NOTE — Discharge Instructions (Addendum)
Make sure you set up a follow-up visit with your primary care practice.  If they are not available for use and you can also contact [facility] to establish care with a new primary care provider.  I will let you know about your lab results through MyChart and send any prescriptions if necessary for your thyroid electronically later.  For moderate to severe constipation (not having a bowel movement in more than 3 days) then try to use Miralax or an enema once daily until you have a good bowel movement.  It is not a good idea to use laxatives regularly, for instance daily.  A medication you could use daily to help with promoting bowel movements is docusate (Colace) 50mg -100mg . It is okay to use this 1-2 times daily as needed as a stool softener.  Try to stay active physically including regular exercise 2-3 times a week.  Make sure you hydrate well every day with about 64 ounces of water daily (that is 2 liters).  Try to avoid carb heavy foods, dairy. This includes cutting out breads, pasta, pizza, pastries, potatoes, rice, starchy foods in general. Eat more fiber as listed below:  Salads - kale, spinach, cabbage, spring mix; use seeds like pumpkin seeds or sunflower seeds, almonds; you can also use 1-2 hard boiled eggs in your salads Fruits - avocadoes, berries (blueberries, raspberries, blackberries), apples, oranges, pomegranate, grapefruit Vegetables - aspargus, cauliflower, broccoli, green beans, brussel spouts, bell peppers; stay away from starchy vegetables like potatoes, carrots, peas  Do not eat any foods on this list that you are allergic to.

## 2020-01-07 NOTE — ED Provider Notes (Signed)
[place]   MRN: [id number] DOB: Apr 30, 1976  Subjective:   Allen Ramos is a 44 y.o. male presenting for 4-day history of acute onset mid to lower abdominal pain, chills and decreased appetite.  Symptoms are constant, not improving and worsening slightly.  Patient states that he ate some spicy food and symptoms started thereafter.  He is also had difficulty with constipation, last bowel movement was yesterday, had a hard stool and was small.  Has been having dysuria as well.  Has a history of hypertension, hyperlipidemia and dyslipidemia.  He has not been back to his primary care provider in months.  He went there today but was told he would not get an appointment for another 1 to 2 months.  He is out of all his medications.  No current facility-administered medications for this encounter.  Current Outpatient Medications:  .  atorvastatin (LIPITOR) 20 MG tablet, Take 1 tablet (20 mg total) by mouth daily. (Patient not taking: Reported on 01/07/2020), Disp: 90 tablet, Rfl: 3 .  cetirizine (ZYRTEC) 10 MG tablet, Take 1 tablet (10 mg total) by mouth daily., Disp: 30 tablet, Rfl: 11 .  clotrimazole (LOTRIMIN) 1 % cream, Apply 1 application topically 2 (two) times daily., Disp: 30 g, Rfl: 0 .  fluticasone (FLONASE) 50 MCG/ACT nasal spray, Place 2 sprays into both nostrils daily., Disp: 16 g, Rfl: 6 .  levothyroxine (SYNTHROID, LEVOTHROID) 125 MCG tablet, Take 125 mcg by mouth daily., Disp: , Rfl: 3 .  lisinopril (PRINIVIL,ZESTRIL) 10 MG tablet, TAKE 1 TABLET BY MOUTH EVERY DAY (Patient not taking: Reported on 01/07/2020), Disp: 30 tablet, Rfl: 0   No Known Allergies  Past Medical History:  Diagnosis Date  . Back pain   . Hypercholesteremia   . Hypertension   . Thyroid disease      History reviewed. No pertinent surgical history.  Family History  Problem Relation Age of Onset  . Hypertension Father   . Hyperlipidemia Father   . Diabetes Mother   . Diabetes Brother     Social  History   Tobacco Use  . Smoking status: Former Smoker    Packs/day: 1.00    Years: 20.00    Pack years: 20.00    Types: Cigarettes    Quit date: 03/19/2016    Years since quitting: 3.8  . Smokeless tobacco: Never Used  . Tobacco comment: Quit five years ago  Substance Use Topics  . Alcohol use: No  . Drug use: No    ROS Denies fever, chest pain, shortness of breath, hematuria, bloody stools.  Objective:   Vitals: BP 135/83 (BP Location: Right Arm)   Pulse 99   Temp 99 F (37.2 C) (Oral)   Resp 18   SpO2 99%   Physical Exam Constitutional:      General: He is not in acute distress.    Appearance: Normal appearance. He is well-developed. He is not ill-appearing, toxic-appearing or diaphoretic.  HENT:     Head: Normocephalic and atraumatic.     Right Ear: External ear normal.     Left Ear: External ear normal.     Nose: Nose normal.     Mouth/Throat:     Mouth: Mucous membranes are moist.     Pharynx: Oropharynx is clear.  Eyes:     General: No scleral icterus.    Extraocular Movements: Extraocular movements intact.     Pupils: Pupils are equal, round, and reactive to light.  Cardiovascular:     Rate and  Rhythm: Normal rate and regular rhythm.     Heart sounds: Normal heart sounds. No murmur. No friction rub. No gallop.   Pulmonary:     Effort: Pulmonary effort is normal. No respiratory distress.     Breath sounds: Normal breath sounds. No stridor. No wheezing, rhonchi or rales.  Abdominal:     General: Bowel sounds are normal. There is no distension.     Palpations: Abdomen is soft. There is no mass.     Tenderness: There is generalized abdominal tenderness and tenderness in the right lower quadrant, periumbilical area, suprapubic area and left lower quadrant. There is no right CVA tenderness, left CVA tenderness, guarding or rebound.     Hernia: No hernia is present.  Skin:    General: Skin is warm and dry.  Neurological:     Mental Status: He is alert and  oriented to person, place, and time.  Psychiatric:        Mood and Affect: Mood normal.        Behavior: Behavior normal.        Thought Content: Thought content normal.     Results for orders placed or performed during the hospital encounter of 01/07/20 (from the past 24 hour(s))  POCT urinalysis dip (device)     Status: Abnormal   Collection Time: 01/07/20 10:59 AM  Result Value Ref Range   Glucose, UA NEGATIVE NEGATIVE mg/dL   Bilirubin Urine NEGATIVE NEGATIVE   Ketones, ur NEGATIVE NEGATIVE mg/dL   Specific Gravity, Urine 1.025 1.005 - 1.030   Hgb urine dipstick SMALL (A) NEGATIVE   pH 7.0 5.0 - 8.0   Protein, ur NEGATIVE NEGATIVE mg/dL   Urobilinogen, UA 0.2 0.0 - 1.0 mg/dL   Nitrite NEGATIVE NEGATIVE   Leukocytes,Ua NEGATIVE NEGATIVE  POC CBG monitoring     Status: None   Collection Time: 01/07/20 11:00 AM  Result Value Ref Range   Glucose-Capillary 98 70 - 99 mg/dL     DG Abd 1 View  Result Date: 01/07/2020 CLINICAL DATA:  Abdominal pain and constipation. EXAM: ABDOMEN - 1 VIEW COMPARISON:  None. FINDINGS: Small bowel pattern is normal. Moderate amount of fecal matter in the colon, not outside of the range often seen. No abnormal calcifications or bone findings. IMPRESSION: Moderate amount of fecal matter in the colon, not grossly outside of the range often seen. Electronically Signed   By: Paulina Fusi M.D.   On: 01/07/2020 11:35    Assessment and Plan :   PDMP not reviewed this encounter.  1. Generalized abdominal pain   2. Essential hypertension   3. Hypothyroidism, unspecified type   4. Dyslipidemia   5. Dysuria   6. Constipation, unspecified constipation type     Counseled on need to manage for constipation. Suspect that this is secondary to his hypothyroidism. Refilled his lisinopril and atorvastatin. Will likely start patient on 88 mcg of levothyroxine pending his lab results. Recommended he set up a follow-up appointment with his PCP for establish care with  a new PCP through [facility] internal medicine. Counseled patient on potential for adverse effects with medications prescribed/recommended today, ER and return-to-clinic precautions discussed, patient verbalized understanding.    Wallis Bamberg, PA-C 01/07/20 1143

## 2020-01-07 NOTE — ED Triage Notes (Signed)
Pt sts some middle abd pain x 4 days with some chills at night; pt also sts not taking htn, thyroid or cholesterol meds x 3 months

## 2020-01-07 NOTE — Progress Notes (Signed)
Discussed this with patient, he is to start levothyroxine at 75 mcg.  Emphasized need for follow-up with his PCP or establish care with a new one for recheck.

## 2020-01-08 LAB — T3, FREE: T3, Free: 2 pg/mL (ref 2.0–4.4)

## 2020-03-11 ENCOUNTER — Ambulatory Visit (INDEPENDENT_AMBULATORY_CARE_PROVIDER_SITE_OTHER): Payer: 59

## 2020-03-11 ENCOUNTER — Other Ambulatory Visit: Payer: Self-pay

## 2020-03-11 DIAGNOSIS — Z23 Encounter for immunization: Secondary | ICD-10-CM | POA: Diagnosis not present

## 2020-03-11 NOTE — Progress Notes (Signed)
   Covid-19 Vaccination Clinic  Name:  Kolten Ryback    MRN: [id number] DOB: 1976/01/09  03/11/2020  Mr. Buntyn was observed post Covid-19 immunization for 15 minutes without incident. He was provided with Vaccine Information Sheet and instruction to access the V-Safe system.   Mr. Biel was instructed to call 911 with any severe reactions post vaccine: Marland Kitchen Difficulty breathing  . Swelling of face and throat  . A fast heartbeat  . A bad rash all over body  . Dizziness and weakness   Immunizations Administered    Name Date Dose VIS Date Route   Pfizer COVID-19 Vaccine 03/11/2020 11:24 AM 0.3 mL 09/24/2018 Intramuscular   Manufacturer: [employer], [employer]   Lot: [id number]   NDC: [address]-1

## 2020-04-01 ENCOUNTER — Ambulatory Visit: Payer: 59

## 2020-04-01 ENCOUNTER — Other Ambulatory Visit: Payer: Self-pay

## 2021-03-04 ENCOUNTER — Emergency Department (HOSPITAL_BASED_OUTPATIENT_CLINIC_OR_DEPARTMENT_OTHER)
Admission: EM | Admit: 2021-03-04 | Discharge: 2021-03-04 | Disposition: A | Discharge status: Home / Self Care | Payer: 59 | Source: Home / Self Care | Attending: Emergency Medicine | Admitting: Emergency Medicine

## 2021-03-04 ENCOUNTER — Encounter (HOSPITAL_BASED_OUTPATIENT_CLINIC_OR_DEPARTMENT_OTHER): Payer: Self-pay

## 2021-03-04 ENCOUNTER — Encounter (HOSPITAL_COMMUNITY): Payer: Self-pay | Admitting: Emergency Medicine

## 2021-03-04 ENCOUNTER — Other Ambulatory Visit: Payer: Self-pay

## 2021-03-04 ENCOUNTER — Emergency Department (HOSPITAL_COMMUNITY): Payer: 59

## 2021-03-04 ENCOUNTER — Emergency Department (HOSPITAL_COMMUNITY)
Admission: EM | Admit: 2021-03-04 | Discharge: 2021-03-04 | Disposition: A | Discharge status: Home / Self Care | Payer: 59 | Attending: Emergency Medicine | Admitting: Emergency Medicine

## 2021-03-04 DIAGNOSIS — W208XXA Other cause of strike by thrown, projected or falling object, initial encounter: Secondary | ICD-10-CM | POA: Insufficient documentation

## 2021-03-04 DIAGNOSIS — Z79899 Other long term (current) drug therapy: Secondary | ICD-10-CM | POA: Insufficient documentation

## 2021-03-04 DIAGNOSIS — S99922A Unspecified injury of left foot, initial encounter: Secondary | ICD-10-CM | POA: Diagnosis present

## 2021-03-04 DIAGNOSIS — S9032XA Contusion of left foot, initial encounter: Secondary | ICD-10-CM | POA: Insufficient documentation

## 2021-03-04 DIAGNOSIS — Z87891 Personal history of nicotine dependence: Secondary | ICD-10-CM | POA: Insufficient documentation

## 2021-03-04 DIAGNOSIS — Y92810 Car as the place of occurrence of the external cause: Secondary | ICD-10-CM | POA: Insufficient documentation

## 2021-03-04 DIAGNOSIS — I10 Essential (primary) hypertension: Secondary | ICD-10-CM | POA: Insufficient documentation

## 2021-03-04 DIAGNOSIS — M79672 Pain in left foot: Secondary | ICD-10-CM | POA: Insufficient documentation

## 2021-03-04 NOTE — ED Triage Notes (Signed)
Patient reports pain with swelling at left great toe injured 2 days ago when the car door fell on it .

## 2021-03-04 NOTE — ED Triage Notes (Signed)
Left great toe injury after dropping car door on it 2 days ago. XR completed prior to arrival at [facility] ER.

## 2021-03-04 NOTE — ED Provider Notes (Signed)
[facility] EMERGENCY DEPARTMENT Provider Note   CSN: [id number] Arrival date & time: 03/04/21  0548     History Chief Complaint  Patient presents with   Foot Pain    Allen Ramos is a 45 y.o. male.  Patient dropped object on his left big toe several days ago.  Has pain to the left big toe.  States was a car door.  The history is provided by the patient.  Foot Pain This is a new problem. The current episode started 2 days ago. The problem occurs constantly. The problem has not changed since onset.The symptoms are aggravated by walking. Nothing relieves the symptoms. He has tried nothing for the symptoms. The treatment provided no relief.      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 No pertinent surgical history.     Family History  Problem Relation Age of Onset   Hypertension Father    Hyperlipidemia Father    Diabetes Mother    Diabetes Brother     Social History   Tobacco Use   Smoking status: Former    Packs/day: 1.00    Years: 20.00    Pack years: 20.00    Types: Cigarettes    Quit date: 03/19/2016    Years since quitting: 4.9   Smokeless tobacco: Never   Tobacco comments:    Quit five years ago  Vaping Use   Vaping Use: Never used  Substance Use Topics   Alcohol use: No   Drug use: No    Home Medications Prior to Admission medications   Medication Sig Start Date End Date Taking? Authorizing Provider  atorvastatin (LIPITOR) 20 MG tablet Take 1 tablet (20 mg total) by mouth daily. 01/07/20   Wallis Bamberg, PA-C  cetirizine (ZYRTEC) 10 MG tablet Take 1 tablet (10 mg total) by mouth daily. 08/02/17    Benjiman Core D, PA-C  clotrimazole (LOTRIMIN) 1 % cream Apply 1 application topically 2 (two) times daily. 11/22/17   Benjiman Core D, PA-C  fluticasone (FLONASE) 50 MCG/ACT nasal spray Place 2 sprays into both nostrils daily. 08/02/17   Magdalene River, PA-C  levothyroxine (SYNTHROID) 75 MCG tablet Take 1 tablet (75 mcg total) by mouth daily before breakfast. 01/07/20   Wallis Bamberg, PA-C  lisinopril (ZESTRIL) 10 MG tablet Take 1 tablet (10 mg total) by mouth daily. 01/07/20   Wallis Bamberg, PA-C  polyethylene glycol (MIRALAX) 17 g packet Take 17 g by mouth daily as needed for moderate constipation or severe constipation. 01/07/20   Wallis Bamberg, PA-C    Allergies    Patient has no known allergies.  Review of Systems   Review of Systems  Musculoskeletal:  Positive for arthralgias and gait problem. Negative for back pain, joint swelling, myalgias, neck pain and neck stiffness.  Skin:  Positive for color change. Negative for rash and wound.  Neurological:  Negative for weakness and numbness.   Physical Exam Updated Vital Signs BP (!) 172/108   Pulse 67   Temp 97.6 F (36.4 C) (Oral)   Resp 18   Ht 5\' 10"  (1.778 m)  Wt 76 kg   SpO2 100%   BMI 24.04 kg/m   Physical Exam Constitutional:      General: He is not in acute distress.    Appearance: He is not ill-appearing.  Cardiovascular:     Pulses: Normal pulses.  Musculoskeletal:        General: Tenderness and signs of injury present. No swelling or deformity. Normal range of motion.     Cervical back: Normal range of motion.     Comments: Bruising to left big toe with looks like resolving hematoma underneath the left toenail  Skin:    General: Skin is warm.     Capillary Refill: Capillary refill takes less than 2 seconds.  Neurological:     Mental Status: He is alert.     Sensory: No sensory deficit.     Motor: No weakness.    ED Results / Procedures / Treatments   Labs (all labs ordered are listed, but only abnormal  results are displayed) Labs Reviewed - No data to display  EKG None  Radiology DG Toe Great Left  Result Date: 03/04/2021 CLINICAL DATA:  Trauma to the left great toe. EXAM: LEFT GREAT TOE COMPARISON:  None. FINDINGS: There is no acute fracture or dislocation the bones are well mineralized. No arthritic changes. Mild soft tissue swelling of the great toe. No radiopaque foreign object or soft tissue gas. IMPRESSION: No acute osseous pathology. Electronically Signed   By: Elgie Collard M.D.   On: 03/04/2021 02:59    Procedures Procedures   Medications Ordered in ED Medications - No data to display  ED Course  I have reviewed the triage vital signs and the nursing notes.  Pertinent labs & imaging results that were available during my care of the patient were reviewed by me and considered in my medical decision making (see chart for details).    MDM Rules/Calculators/A&P                           Allen Ramos is here with left toe pain.  Dropped heavy object on his left toe several days ago.  Has some bruising to the left big toe but x-ray negative for fracture.  Overall suspect contusion.  Neurovascularly neuromuscularly intact.Placed in a postop shoe and discharged in ED in good condition.  Recommend ice, Tylenol, Motrin.  This chart was dictated using voice recognition software.  Despite best efforts to proofread,  errors can occur which can change the documentation meaning.   Final Clinical Impression(s) / ED Diagnoses Final diagnoses:  Contusion of left foot, initial encounter    Rx / DC Orders ED Discharge Orders     None        Virgina Norfolk, DO 03/04/21 0814

## 2021-03-06 ENCOUNTER — Emergency Department (HOSPITAL_BASED_OUTPATIENT_CLINIC_OR_DEPARTMENT_OTHER)
Admission: EM | Admit: 2021-03-06 | Discharge: 2021-03-06 | Disposition: A | Discharge status: Home / Self Care | Payer: 59 | Attending: Emergency Medicine | Admitting: Emergency Medicine

## 2021-03-06 ENCOUNTER — Other Ambulatory Visit: Payer: Self-pay

## 2021-03-06 ENCOUNTER — Encounter (HOSPITAL_BASED_OUTPATIENT_CLINIC_OR_DEPARTMENT_OTHER): Payer: Self-pay

## 2021-03-06 DIAGNOSIS — Z79899 Other long term (current) drug therapy: Secondary | ICD-10-CM | POA: Diagnosis not present

## 2021-03-06 DIAGNOSIS — Z87891 Personal history of nicotine dependence: Secondary | ICD-10-CM | POA: Insufficient documentation

## 2021-03-06 DIAGNOSIS — W208XXA Other cause of strike by thrown, projected or falling object, initial encounter: Secondary | ICD-10-CM | POA: Diagnosis not present

## 2021-03-06 DIAGNOSIS — S90222A Contusion of left lesser toe(s) with damage to nail, initial encounter: Secondary | ICD-10-CM

## 2021-03-06 DIAGNOSIS — S90212A Contusion of left great toe with damage to nail, initial encounter: Secondary | ICD-10-CM | POA: Insufficient documentation

## 2021-03-06 DIAGNOSIS — I10 Essential (primary) hypertension: Secondary | ICD-10-CM | POA: Insufficient documentation

## 2021-03-06 DIAGNOSIS — S99921A Unspecified injury of right foot, initial encounter: Secondary | ICD-10-CM | POA: Diagnosis present

## 2021-03-06 MED ORDER — BACITRACIN ZINC 500 UNIT/GM EX OINT
TOPICAL_OINTMENT | Freq: Once | CUTANEOUS | Status: AC
  Administered 2021-03-06: 1 via TOPICAL

## 2021-03-06 NOTE — ED Triage Notes (Signed)
Pt returned to ED and triage started

## 2021-03-06 NOTE — ED Notes (Signed)
Returns to ED for continued complaints of toe pain, here for re-evaluation

## 2021-03-06 NOTE — ED Provider Notes (Signed)
[facility] EMERGENCY DEPARTMENT Provider Note   CSN: [id number] Arrival date & time: 03/06/21  1104     History Chief Complaint  Patient presents with   Toe Pain    Allen Ramos is a 45 y.o. male.  Patient is a 45 year old with a history of hypertension and hyperlipidemia who presents as a return visit for toe pain.  He was seen here 2 days ago after he dropped an object on his left big toe.  He said that its been bleeding under the toenail and he was concerned it may be infected.  He says actually since the blood started coming out, the pain is improved.  He has minimal pain currently.  No fevers.  He had an x-ray on his prior visit which showed no evidence of fracture.      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 No pertinent surgical history.     Family History  Problem Relation Age of Onset   Hypertension Father    Hyperlipidemia Father    Diabetes Mother    Diabetes Brother     Social History   Tobacco Use   Smoking status: Former    Packs/day: 1.00    Years: 20.00    Pack years: 20.00    Types: Cigarettes    Quit date: 03/19/2016    Years since quitting: 4.9   Smokeless tobacco: Never   Tobacco comments:    Quit five years ago  Vaping Use   Vaping Use: Never used  Substance Use Topics   Alcohol use: No   Drug use: No    Home Medications Prior to Admission medications   Medication Sig Start Date End Date Taking? Authorizing Provider  atorvastatin (LIPITOR) 20 MG tablet Take 1 tablet (20 mg total) by mouth daily. 01/07/20   Wallis Bamberg, PA-C  cetirizine (ZYRTEC) 10 MG tablet Take 1 tablet (10 mg  total) by mouth daily. 08/02/17   Benjiman Core D, PA-C  clotrimazole (LOTRIMIN) 1 % cream Apply 1 application topically 2 (two) times daily. 11/22/17   Benjiman Core D, PA-C  fluticasone (FLONASE) 50 MCG/ACT nasal spray Place 2 sprays into both nostrils daily. 08/02/17   Magdalene River, PA-C  levothyroxine (SYNTHROID) 75 MCG tablet Take 1 tablet (75 mcg total) by mouth daily before breakfast. 01/07/20   Wallis Bamberg, PA-C  lisinopril (ZESTRIL) 10 MG tablet Take 1 tablet (10 mg total) by mouth daily. 01/07/20   Wallis Bamberg, PA-C  polyethylene glycol (MIRALAX) 17 g packet Take 17 g by mouth daily as needed for moderate constipation or severe constipation. 01/07/20   Wallis Bamberg, PA-C    Allergies    Patient has no known allergies.  Review of Systems   Review of Systems  Constitutional:  Negative for fever.  Gastrointestinal:  Negative for nausea and vomiting.  Musculoskeletal:  Positive for arthralgias and joint swelling. Negative for back pain and neck pain.  Skin:  Positive for wound.  Neurological:  Negative for weakness, numbness and headaches.   Physical Exam Updated Vital Signs BP (!) 178/105 (BP Location: Left Arm)  Pulse 80   Temp 98.2 F (36.8 C) (Oral)   Resp 16   Ht 5\' 10"  (1.778 m)   Wt 69.9 kg   SpO2 97%   BMI 22.10 kg/m   Physical Exam Constitutional:      Appearance: He is well-developed.  HENT:     Head: Normocephalic and atraumatic.  Cardiovascular:     Rate and Rhythm: Normal rate.  Pulmonary:     Effort: Pulmonary effort is normal.  Musculoskeletal:        General: Tenderness present.     Cervical back: Normal range of motion and neck supple.     Comments: Mild generalized tenderness to the distal phalanx of the left big toe.  There is a subungual hematoma which is currently draining.  The nail has lifted up slightly.  There is no warmth or erythema.  No purulent drainage.  Skin:    General: Skin is warm and dry.  Neurological:     Mental Status: He  is alert and oriented to person, place, and time.    ED Results / Procedures / Treatments   Labs (all labs ordered are listed, but only abnormal results are displayed) Labs Reviewed - No data to display  EKG None  Radiology No results found.  Procedures Procedures   Medications Ordered in ED Medications  bacitracin ointment (has no administration in time range)    ED Course  I have reviewed the triage vital signs and the nursing notes.  Pertinent labs & imaging results that were available during my care of the patient were reviewed by me and considered in my medical decision making (see chart for details).    MDM Rules/Calculators/A&P                           Patient with a prior crush injury of his toe.  He has a subungual hematoma which has already drained and is in the process of ongoing draining.  It appears that the nail has lifted up and will likely fall off.  I explained this to him.  I advised its unpredictable whether the new nail will grow back normally.  There is no suggestions of infection.  He was given wound care instructions and return precautions for any suggestions of infection.  His blood pressure is elevated.  This will rechecked and he was advised to follow-up with his PCP regarding this. Final Clinical Impression(s) / ED Diagnoses Final diagnoses:  Subungual hematoma of toe of left foot, initial encounter    Rx / DC Orders ED Discharge Orders     None        , MD 03/06/21 1313

## 2021-03-06 NOTE — ED Triage Notes (Signed)
Pt arrives with c/o pain to left great toe pt was seen here two days ago for the same.

## 2021-07-18 ENCOUNTER — Other Ambulatory Visit: Payer: Self-pay | Admitting: Family Medicine

## 2021-07-18 DIAGNOSIS — R768 Other specified abnormal immunological findings in serum: Secondary | ICD-10-CM

## 2021-07-18 DIAGNOSIS — E039 Hypothyroidism, unspecified: Secondary | ICD-10-CM

## 2021-11-16 ENCOUNTER — Ambulatory Visit: Payer: 59 | Admitting: Internal Medicine

## 2021-11-16 NOTE — Progress Notes (Deleted)
Name: Allen Ramos  MRN/ DOB: [id number], 1975/10/20    Age/ Sex: 46 y.o., male    PCP: Patient, No Pcp Per (Inactive)   Reason for Endocrinology Evaluation: Hypothyroidism     Date of Initial Endocrinology Evaluation: 11/16/2021     HPI: Allen Ramos is a 46 y.o. male with a past medical history of HTN, and dyslipidemia. The patient presented for initial endocrinology clinic visit on 11/16/2021 for consultative assistance with his Hypothyroidism.   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  Social History:  reports that he quit smoking about 5 years ago. His smoking use included cigarettes. He has a 20.00 pack-year smoking history. He has never used smokeless tobacco. He reports that he does not drink alcohol and does not use drugs. Family History: family history includes Diabetes in his brother and mother; Hyperlipidemia in his father; Hypertension in his father.   HOME MEDICATIONS: Allergies as of 11/16/2021   No Known Allergies      Medication List        Accurate as of November 16, 2021  9:14 AM. If you have any questions, ask your nurse or doctor.          atorvastatin 20 MG tablet Commonly known as: LIPITOR Take 1 tablet (20 mg total) by mouth daily.   cetirizine 10 MG tablet Commonly known as: ZYRTEC Take 1 tablet (10 mg total) by mouth daily.   clotrimazole 1 % cream Commonly known as: LOTRIMIN Apply 1 application topically 2 (two) times daily.   fluticasone 50 MCG/ACT nasal spray Commonly known as: FLONASE Place 2 sprays into both nostrils daily.   levothyroxine 75 MCG tablet Commonly known as: Synthroid Take 1 tablet (75 mcg total) by mouth daily  before breakfast.   lisinopril 10 MG tablet Commonly known as: ZESTRIL Take 1 tablet (10 mg total) by mouth daily.   polyethylene glycol 17 g packet Commonly known as: MiraLax Take 17 g by mouth daily as needed for moderate constipation or severe constipation.          REVIEW OF SYSTEMS: A comprehensive ROS was conducted with the patient and is negative except as per HPI and below:  ROS     OBJECTIVE:  VS: There were no vitals taken for this visit.   Wt Readings from Last 3 Encounters:  03/06/21 154 lb (69.9 kg)  03/04/21 167 lb 8.8 oz (76 kg)  03/04/21 167 lb 8.8 oz (76 kg)     EXAM: General: Pt appears well and is in NAD  Neck: General: Supple without adenopathy. Thyroid: Thyroid size normal.  No goiter or nodules appreciated.   Lungs: Clear with good BS bilat with no rales, rhonchi, or wheezes  Heart: Auscultation: RRR.  Abdomen: Normoactive bowel sounds, soft, nontender, without masses or organomegaly palpable  Extremities:  BL LE: No pretibial edema normal ROM and strength.  Mental Status: Judgment, insight: Intact Orientation: Oriented to time, place, and person Mood and affect: No depression, anxiety, or agitation     DATA REVIEWED: *** 07/14/2021 TSH 11.55 uIU/mL   ASSESSMENT/PLAN/RECOMMENDATIONS:   Hashimoto's thyroiditis:    Medications :  Signed electronically by: Deloria Lair  Nena Jordan, MD  [facility]  [facility] [address] [place], [place] [address] Phone: [phone] FAX: [phone]   CC: Patient, No Pcp Per (Inactive) No address on file Phone: None Fax: None   Return to Endocrinology clinic as below: Future Appointments  Date Time Provider [place]  11/16/2021  2:00 PM Amaira Safley, Melanie Crazier, MD [id number] None

## 2023-01-23 ENCOUNTER — Encounter (HOSPITAL_BASED_OUTPATIENT_CLINIC_OR_DEPARTMENT_OTHER): Payer: Self-pay

## 2023-01-23 ENCOUNTER — Emergency Department (HOSPITAL_BASED_OUTPATIENT_CLINIC_OR_DEPARTMENT_OTHER)
Admission: EM | Admit: 2023-01-23 | Discharge: 2023-01-23 | Disposition: A | Discharge status: Home / Self Care | Payer: BLUE CROSS/BLUE SHIELD | Attending: Emergency Medicine | Admitting: Emergency Medicine

## 2023-01-23 ENCOUNTER — Other Ambulatory Visit: Payer: Self-pay

## 2023-01-23 DIAGNOSIS — H9201 Otalgia, right ear: Secondary | ICD-10-CM | POA: Diagnosis present

## 2023-01-23 DIAGNOSIS — H6121 Impacted cerumen, right ear: Secondary | ICD-10-CM | POA: Insufficient documentation

## 2023-01-23 DIAGNOSIS — I10 Essential (primary) hypertension: Secondary | ICD-10-CM | POA: Insufficient documentation

## 2023-01-23 DIAGNOSIS — Z79899 Other long term (current) drug therapy: Secondary | ICD-10-CM | POA: Diagnosis not present

## 2023-01-23 NOTE — ED Notes (Signed)
RT ear flushed with warm water, large chunk of wax dislodged and removed

## 2023-01-23 NOTE — Discharge Instructions (Signed)
Thank you for letting us take care of you today.  We flushed your ear as it had a lot of wax buildup. You can use Debrox which can be purchased over the counter to help with wax buildup. I recommend not using q-tips as this can push wax back and make it harder to drain.  For new or worsening symptoms, follow up with your PCP or return to ED for re-evaluation.

## 2023-01-23 NOTE — ED Triage Notes (Signed)
Pt states he was cleaning his ears and has "cotton" stuck in his ear and is causing pain

## 2023-01-24 NOTE — ED Provider Notes (Signed)
[place] [facility] Provider Note   CSN: [id number] Arrival date & time: 01/23/23  2016     History  Chief Complaint  Patient presents with   Otalgia    Nix Elzey is a 47 y.o. male with past medical history hyperlipidemia, hypertension, and thyroid disease who presents to the ED complaining of right ear pain that started today.  He states that he was cleaning the ear with a Q-tip and the cotton portion of the Q-tip came off the steak and he believes that it is lodged in his ear.  No fever, cough, congestion, rhinorrhea, shortness of breath, ear drainage, or other symptoms.  No hearing changes.      Home Medications Prior to Admission medications   Medication Sig Start Date End Date Taking? Authorizing Provider  atorvastatin (LIPITOR) 20 MG tablet Take 1 tablet (20 mg total) by mouth daily. 01/07/20   Wallis Bamberg, PA-C  cetirizine (ZYRTEC) 10 MG tablet Take 1 tablet (10 mg total) by mouth daily. 08/02/17   Benjiman Core D, PA-C  clotrimazole (LOTRIMIN) 1 % cream Apply 1 application topically 2 (two) times daily. 11/22/17   Benjiman Core D, PA-C  fluticasone (FLONASE) 50 MCG/ACT nasal spray Place 2 sprays into both nostrils daily. 08/02/17   Magdalene River, PA-C  levothyroxine (SYNTHROID) 75 MCG tablet Take 1 tablet (75 mcg total) by mouth daily before breakfast. 01/07/20   Wallis Bamberg, PA-C  lisinopril (ZESTRIL) 10 MG tablet Take 1 tablet (10 mg total) by mouth daily. 01/07/20   Wallis Bamberg, PA-C  polyethylene glycol (MIRALAX) 17 g packet Take 17 g by mouth daily as needed for moderate constipation or severe constipation. 01/07/20   Wallis Bamberg, PA-C      Allergies    Patient has no known allergies.    Review of Systems   Review of Systems  All other systems reviewed and are negative.   Physical Exam Updated Vital Signs BP (!) 153/81 (BP Location: Left Arm)   Pulse 91   Temp 98.2 F (36.8 C)   Resp 18   Ht 5\' 11"  (1.803 m)   Wt  71.2 kg   SpO2 98%   BMI 21.90 kg/m  Physical Exam Vitals and nursing note reviewed.  Constitutional:      General: He is not in acute distress.    Appearance: Normal appearance.  HENT:     Head: Normocephalic and atraumatic.     Right Ear: External ear normal.     Left Ear: External ear normal.     Ears:     Comments: Large amounts of cerumen in bilateral ear canals, completely impacting on the right with inability to visualize TM and portions of dislodged wax floating in canal, portion of left TM visualized and non-erythematous and non-bulging, following ear irrigation and wax removal on right R TM also visualized and non-erythematous, non-bulging, and pt with symptomatic relief, no mastoid tenderness, hearing grossly intact    Mouth/Throat:     Mouth: Mucous membranes are moist.  Eyes:     Conjunctiva/sclera: Conjunctivae normal.  Cardiovascular:     Rate and Rhythm: Normal rate and regular rhythm.     Heart sounds: No murmur heard. Pulmonary:     Effort: Pulmonary effort is normal.     Breath sounds: Normal breath sounds.  Abdominal:     General: Abdomen is flat.     Palpations: Abdomen is soft.  Musculoskeletal:        General:  Normal range of motion.     Cervical back: Normal range of motion and neck supple. No rigidity.     Right lower leg: No edema.     Left lower leg: No edema.  Lymphadenopathy:     Cervical: No cervical adenopathy.  Skin:    General: Skin is warm and dry.     Capillary Refill: Capillary refill takes less than 2 seconds.  Neurological:     Mental Status: He is alert. Mental status is at baseline.  Psychiatric:        Behavior: Behavior normal.     ED Results / Procedures / Treatments   Labs (all labs ordered are listed, but only abnormal results are displayed) Labs Reviewed - No data to display  EKG None  Radiology No results found.  Procedures Procedures    Medications Ordered in ED Medications - No data to display  ED Course/  Medical Decision Making/ A&P                             Medical Decision Making  Medical Decision Making:   Rykeem Sellinger is a 47 y.o. male who presented to the ED today with ear pain detailed above.     Complete initial physical exam performed, notably the patient was in NAD. Large amounts of cerumen in bilateral ear canals, completely impacting on the right with inability to visualize TM and portions of dislodged wax floating in canal, portion of left TM visualized and non-erythematous and non-bulging, following ear irrigation and wax removal on right R TM also visualized and non-erythematous, non-bulging, and pt with symptomatic relief, no mastoid tenderness, hearing grossly intact.    Reviewed and confirmed nursing documentation for past medical history, family history, social history.    Initial Assessment:   With the patient's presentation, differential diagnosis includes but is not limited to otitis media, otitis externa, mastoiditis, URI, retained foreign body. This is most consistent with an acute complicated illness  Initial Plan:  Ear irrigation Objective evaluation as below reviewed   Final Assessment and Plan:   47 year old male presents to the ED complaining of right ear pain.  Questions retained foreign body after cotton of a Q-tip fell out today while cleaning his ears.  On exam, he has a cerumen impaction on the right with inability to visualize TM but no other noted abnormalities.  Hearing intact.  No other associated symptoms.  Following irrigation, patient has symptomatic relief and visualized TM is normal.  Discussed ear cleaning recommendations with pt and friend at bedside. Strict ED return precautions given, all questions answered, and stable for discharge.    Clinical Impression:  1. Impacted cerumen of right ear      Discharge           Final Clinical Impression(s) / ED Diagnoses Final diagnoses:  Impacted cerumen of right ear    Rx / DC Orders ED  Discharge Orders     None         Tonette Lederer, PA-C 01/24/23 1529    Franne Forts, DO 01/31/23 1529
